# Patient Record
Sex: Female | Born: 1940 | Race: Black or African American | Hispanic: No | Marital: Married | Smoking: Former smoker
Health system: Southern US, Community
[De-identification: ages and names within clinical notes are randomized; demographics above are authoritative.]

## PROBLEM LIST (undated history)

## (undated) DIAGNOSIS — R42 Dizziness and giddiness: Secondary | ICD-10-CM

## (undated) DIAGNOSIS — Z972 Presence of dental prosthetic device (complete) (partial): Secondary | ICD-10-CM

## (undated) DIAGNOSIS — E669 Obesity, unspecified: Secondary | ICD-10-CM

## (undated) DIAGNOSIS — M1990 Unspecified osteoarthritis, unspecified site: Secondary | ICD-10-CM

## (undated) DIAGNOSIS — K5909 Other constipation: Secondary | ICD-10-CM

## (undated) DIAGNOSIS — F411 Generalized anxiety disorder: Secondary | ICD-10-CM

## (undated) DIAGNOSIS — E119 Type 2 diabetes mellitus without complications: Secondary | ICD-10-CM

## (undated) DIAGNOSIS — I10 Essential (primary) hypertension: Secondary | ICD-10-CM

## (undated) DIAGNOSIS — Z974 Presence of external hearing-aid: Secondary | ICD-10-CM

## (undated) DIAGNOSIS — E785 Hyperlipidemia, unspecified: Secondary | ICD-10-CM

## (undated) HISTORY — PX: OTHER SURGICAL HISTORY: SHX169

## (undated) HISTORY — PX: ABDOMINAL HYSTERECTOMY: SHX81

## (undated) HISTORY — PX: COLONOSCOPY: SHX174

## (undated) HISTORY — PX: LAPAROSCOPIC SIGMOID COLECTOMY: SHX5928

## (undated) HISTORY — PX: TOTAL ABDOMINAL HYSTERECTOMY W/ BILATERAL SALPINGOOPHORECTOMY: SHX83

## (undated) HISTORY — PX: JOINT REPLACEMENT: SHX530

---

## 2004-07-07 ENCOUNTER — Ambulatory Visit: Payer: Self-pay | Admitting: Family Medicine

## 2005-02-16 ENCOUNTER — Ambulatory Visit: Payer: Self-pay | Admitting: Unknown Physician Specialty

## 2005-07-13 ENCOUNTER — Ambulatory Visit: Payer: Self-pay | Admitting: Unknown Physician Specialty

## 2006-07-21 ENCOUNTER — Ambulatory Visit: Payer: Self-pay | Admitting: Unknown Physician Specialty

## 2007-07-26 ENCOUNTER — Ambulatory Visit: Payer: Self-pay | Admitting: Internal Medicine

## 2008-03-21 ENCOUNTER — Ambulatory Visit: Payer: Self-pay | Admitting: Family Medicine

## 2008-06-03 ENCOUNTER — Ambulatory Visit: Payer: Self-pay | Admitting: Gastroenterology

## 2008-07-31 ENCOUNTER — Ambulatory Visit: Payer: Self-pay | Admitting: Family Medicine

## 2009-08-25 ENCOUNTER — Ambulatory Visit: Payer: Self-pay | Admitting: Family Medicine

## 2010-09-04 ENCOUNTER — Ambulatory Visit: Payer: Self-pay | Admitting: Family Medicine

## 2011-09-07 ENCOUNTER — Ambulatory Visit: Payer: Self-pay | Admitting: Family Medicine

## 2012-09-07 ENCOUNTER — Ambulatory Visit: Payer: Self-pay | Admitting: Unknown Physician Specialty

## 2013-09-11 ENCOUNTER — Ambulatory Visit: Payer: Self-pay | Admitting: Internal Medicine

## 2014-02-18 ENCOUNTER — Ambulatory Visit: Payer: Self-pay | Admitting: Gastroenterology

## 2014-03-04 ENCOUNTER — Ambulatory Visit: Payer: Self-pay | Admitting: Surgery

## 2014-03-04 LAB — BASIC METABOLIC PANEL
Anion Gap: 6 — ABNORMAL LOW (ref 7–16)
BUN: 9 mg/dL (ref 7–18)
CALCIUM: 9 mg/dL (ref 8.5–10.1)
Chloride: 106 mmol/L (ref 98–107)
Co2: 25 mmol/L (ref 21–32)
Creatinine: 0.95 mg/dL (ref 0.60–1.30)
EGFR (African American): >60
EGFR (Non-African Amer.): >60
Glucose: 108 mg/dL — ABNORMAL HIGH (ref 65–99)
Osmolality: 273 (ref 275–301)
POTASSIUM: 3.7 mmol/L (ref 3.5–5.1)
Sodium: 137 mmol/L (ref 136–145)

## 2014-03-04 LAB — CBC
HCT: 36.2 % (ref 35.0–47.0)
HGB: 11.8 g/dL — AB (ref 12.0–16.0)
MCH: 32 pg (ref 26.0–34.0)
MCHC: 32.5 g/dL (ref 32.0–36.0)
MCV: 99 fL (ref 80–100)
Platelet: 333 10*3/uL (ref 150–440)
RBC: 3.68 10*6/uL — ABNORMAL LOW (ref 3.80–5.20)
RDW: 15.8 % — ABNORMAL HIGH (ref 11.5–14.5)
WBC: 12.2 10*3/uL — ABNORMAL HIGH (ref 3.6–11.0)

## 2014-03-04 LAB — HEPATIC FUNCTION PANEL A (ARMC)
Albumin: 3.2 g/dL — ABNORMAL LOW (ref 3.4–5.0)
Alkaline Phosphatase: 56 U/L
BILIRUBIN TOTAL: 0.3 mg/dL (ref 0.2–1.0)
Bilirubin, Direct: <0.1 mg/dL (ref 0.00–0.20)
SGOT(AST): 17 U/L (ref 15–37)
SGPT (ALT): 24 U/L (ref 12–78)
TOTAL PROTEIN: 6.7 g/dL (ref 6.4–8.2)

## 2014-03-08 ENCOUNTER — Inpatient Hospital Stay: Payer: Self-pay | Admitting: Surgery

## 2014-03-09 LAB — CBC WITH DIFFERENTIAL/PLATELET
BASOS ABS: 0.1 10*3/uL (ref 0.0–0.1)
Basophil %: 0.6 %
Eosinophil #: 0 10*3/uL (ref 0.0–0.7)
Eosinophil %: 0.1 %
HCT: 35.1 % (ref 35.0–47.0)
HGB: 11.3 g/dL — ABNORMAL LOW (ref 12.0–16.0)
Lymphocyte #: 0.8 10*3/uL — ABNORMAL LOW (ref 1.0–3.6)
Lymphocyte %: 6.3 %
MCH: 31.5 pg (ref 26.0–34.0)
MCHC: 32.1 g/dL (ref 32.0–36.0)
MCV: 98 fL (ref 80–100)
MONO ABS: 1 x10 3/mm — AB (ref 0.2–0.9)
Monocyte %: 7.8 %
NEUTROS PCT: 85.2 %
Neutrophil #: 10.9 10*3/uL — ABNORMAL HIGH (ref 1.4–6.5)
PLATELETS: 319 10*3/uL (ref 150–440)
RBC: 3.58 10*6/uL — ABNORMAL LOW (ref 3.80–5.20)
RDW: 15.7 % — ABNORMAL HIGH (ref 11.5–14.5)
WBC: 12.7 10*3/uL — ABNORMAL HIGH (ref 3.6–11.0)

## 2014-03-09 LAB — BASIC METABOLIC PANEL
Anion Gap: 6 — ABNORMAL LOW (ref 7–16)
BUN: 7 mg/dL (ref 7–18)
CALCIUM: 8.2 mg/dL — AB (ref 8.5–10.1)
CREATININE: 1.37 mg/dL — AB (ref 0.60–1.30)
Chloride: 112 mmol/L — ABNORMAL HIGH (ref 98–107)
Co2: 23 mmol/L (ref 21–32)
EGFR (Non-African Amer.): 39 — ABNORMAL LOW
GFR CALC AF AMER: 45 — AB
GLUCOSE: 253 mg/dL — AB (ref 65–99)
Osmolality: 288 (ref 275–301)
POTASSIUM: 3.6 mmol/L (ref 3.5–5.1)
SODIUM: 141 mmol/L (ref 136–145)

## 2014-03-11 LAB — PATHOLOGY REPORT

## 2014-03-12 LAB — WOUND CULTURE

## 2014-03-13 LAB — PLATELET COUNT: PLATELETS: 309 10*3/uL (ref 150–440)

## 2014-04-09 ENCOUNTER — Telehealth: Payer: Self-pay | Admitting: *Deleted

## 2014-04-09 NOTE — Telephone Encounter (Signed)
Patient called regarding her colostomy output. She is using Miralax daily and her stools are still constipated. She talked with her home nurse and she told her to use Colace/stool softner and she was wanting to make sure. I agreed and told her to drink plenty of water and that she can adjust the Miralax (more or less) based on her stool consisently. She agrees.

## 2014-09-09 ENCOUNTER — Ambulatory Visit: Payer: Self-pay | Admitting: Gastroenterology

## 2014-09-12 ENCOUNTER — Ambulatory Visit: Payer: Self-pay | Admitting: Internal Medicine

## 2014-09-16 ENCOUNTER — Ambulatory Visit: Payer: Self-pay | Admitting: Surgery

## 2014-09-23 ENCOUNTER — Ambulatory Visit: Payer: Self-pay | Admitting: Surgery

## 2014-09-23 LAB — CBC WITH DIFFERENTIAL/PLATELET
BASOS PCT: 1 %
Basophil #: 0.1 10*3/uL (ref 0.0–0.1)
EOS ABS: 0.4 10*3/uL (ref 0.0–0.7)
Eosinophil %: 4 %
HCT: 44.5 % (ref 35.0–47.0)
HGB: 14.6 g/dL (ref 12.0–16.0)
Lymphocyte #: 2.5 10*3/uL (ref 1.0–3.6)
Lymphocyte %: 24 %
MCH: 33.4 pg (ref 26.0–34.0)
MCHC: 32.8 g/dL (ref 32.0–36.0)
MCV: 102 fL — ABNORMAL HIGH (ref 80–100)
Monocyte #: 0.9 x10 3/mm (ref 0.2–0.9)
Monocyte %: 8.3 %
NEUTROS ABS: 6.6 10*3/uL — AB (ref 1.4–6.5)
Neutrophil %: 62.7 %
PLATELETS: 281 10*3/uL (ref 150–440)
RBC: 4.38 10*6/uL (ref 3.80–5.20)
RDW: 12.9 % (ref 11.5–14.5)
WBC: 10.6 10*3/uL (ref 3.6–11.0)

## 2014-09-23 LAB — COMPREHENSIVE METABOLIC PANEL
ALBUMIN: 3.7 g/dL (ref 3.4–5.0)
Alkaline Phosphatase: 66 U/L
Anion Gap: 7 (ref 7–16)
BILIRUBIN TOTAL: 0.3 mg/dL (ref 0.2–1.0)
BUN: 18 mg/dL (ref 7–18)
CALCIUM: 9.5 mg/dL (ref 8.5–10.1)
Chloride: 105 mmol/L (ref 98–107)
Co2: 26 mmol/L (ref 21–32)
Creatinine: 0.86 mg/dL (ref 0.60–1.30)
EGFR (African American): >60
GLUCOSE: 94 mg/dL (ref 65–99)
Osmolality: 277 (ref 275–301)
Potassium: 4.2 mmol/L (ref 3.5–5.1)
SGOT(AST): 31 U/L (ref 15–37)
SGPT (ALT): 23 U/L
SODIUM: 138 mmol/L (ref 136–145)
Total Protein: 7.2 g/dL (ref 6.4–8.2)

## 2014-10-03 ENCOUNTER — Inpatient Hospital Stay: Payer: Self-pay | Admitting: Surgery

## 2014-10-04 LAB — CBC WITH DIFFERENTIAL/PLATELET
BASOS ABS: 0.1 10*3/uL (ref 0.0–0.1)
BASOS PCT: 0.6 %
EOS ABS: 0 10*3/uL (ref 0.0–0.7)
Eosinophil %: 0.2 %
HCT: 38.4 % (ref 35.0–47.0)
HGB: 12.8 g/dL (ref 12.0–16.0)
LYMPHS ABS: 1.4 10*3/uL (ref 1.0–3.6)
Lymphocyte %: 10.5 %
MCH: 33.7 pg (ref 26.0–34.0)
MCHC: 33.3 g/dL (ref 32.0–36.0)
MCV: 101 fL — ABNORMAL HIGH (ref 80–100)
MONO ABS: 1 x10 3/mm — AB (ref 0.2–0.9)
Monocyte %: 7.4 %
Neutrophil #: 10.7 10*3/uL — ABNORMAL HIGH (ref 1.4–6.5)
Neutrophil %: 81.3 %
PLATELETS: 218 10*3/uL (ref 150–440)
RBC: 3.79 10*6/uL — ABNORMAL LOW (ref 3.80–5.20)
RDW: 13.3 % (ref 11.5–14.5)
WBC: 13.2 10*3/uL — ABNORMAL HIGH (ref 3.6–11.0)

## 2014-10-04 LAB — BASIC METABOLIC PANEL
ANION GAP: 6 — AB (ref 7–16)
BUN: 8 mg/dL (ref 7–18)
CHLORIDE: 108 mmol/L — AB (ref 98–107)
CO2: 24 mmol/L (ref 21–32)
Calcium, Total: 8.1 mg/dL — ABNORMAL LOW (ref 8.5–10.1)
Creatinine: 1.11 mg/dL (ref 0.60–1.30)
EGFR (African American): >60
GFR CALC NON AF AMER: 51 — AB
Glucose: 191 mg/dL — ABNORMAL HIGH (ref 65–99)
OSMOLALITY: 279 (ref 275–301)
POTASSIUM: 4.3 mmol/L (ref 3.5–5.1)
SODIUM: 138 mmol/L (ref 136–145)

## 2014-10-08 LAB — PLATELET COUNT: Platelet: 290 10*3/uL (ref 150–440)

## 2015-01-25 NOTE — Op Note (Signed)
PATIENT NAME:  Rebecca Frazier, [name] MR#:  [id number] DATE OF BIRTH:  [birth year]  DATE OF PROCEDURE:  03/08/2014  PREOPERATIVE DIAGNOSIS: Diverticulitis with colovaginal fistula.   POSTOPERATIVE DIAGNOSIS: Diverticular abscess with colovaginal fistula.   PROCEDURE: Sigmoid colon resection with Hartmann procedure and drainage of abscess.   SURGEON: Renda RollsWilton Raimi Guillermo, M.D.   ANESTHESIA: General.   INDICATIONS: This 74 year old female has history of abdominal pain, diverticulitis and feculent discharge from the vagina. Surgery was recommended for definitive treatment. It is also that she has had a previous hysterectomy.   DESCRIPTION OF PROCEDURE: The patient was placed on the operating table in the supine position under general endotracheal anesthesia. Legs were elevated into the lithotomy position using bumblebee stirrups. The sigmoidoscope was inserted into the rectum and advanced up 17 cm. The rectal mucosa  appeared normal. No polyps or tumors were seen and it appeared that the scope would not go beyond this point and the scope was gradually pulled back and removed.   The circulating nurse inserted a Foley urinary catheter with Betadine preparation of the perineum, draining clear yellow urine. The abdomen was prepared with ChloraPrep and the perineum and anal area were prepared with Betadine solution. The abdomen was draped out in a sterile manner.   A lower abdominal midline incision was made at the site of an old scar. She does not have an  umbilicus. The incision was carried down through subcutaneous tissues. There was a hernia found at the upper end of the incision containing omentum and this hernia defect was incised and further recognize the extent of the defect, which was small. The abdominal cavity was opened in the midline. The omentum was dissected away from the hernia defect and away from the abdominal wall. Further inspection revealed there was no palpable mass within the liver. There was no  other palpable mass felt elsewhere in the abdomen. There was a firm area of inflammation and mass formation in the pelvis. A Balfour retractor was used and lap packs were inserted to retract the small bowel out of the pelvis. Multiple diverticula were noted in the sigmoid colon. There was a hard inflammatory mass, which is densely adherent to the left pelvic sidewall and also to the region of the posterior aspect of the bladder and the region of the vagina. This was dissected partially with sharp dissection and partially with finger fracture and partially with Harmonic scalpel and bipolar cautery. There was an abscess encountered, which drained yellow pus. A swab was submitted for routine culture. This abscess cavity was opened large enough to insert a finger and it appeared to be approximately 4 cm in dimension. Several small bleeding points were cauterized.   Next, the markedly inflamed portion of sigmoid colon was further mobilized. There was one bleeding point, which was suture ligated with 0 chromic and divided with the Harmonic scalpel. This inflammatory mass was further mobilized and divided the bowel proximal to the mass in an area where the bowel was soft by creating a window in the mesentery and beginning the mesenteric dissection with the Harmonic scalpel and then the bowel was divided with the 75 mm GIA stapler. The staple line appeared to be hemostatic.   Next, the mesenteric dissection was further carried out with the Harmonic scalpel and bipolar cautery and dissected down to a more normal-appearing segment of bowel. Decision was made not to do a primary anastomosis due to the presence of pus in the operative field and marked inflammation. A TA-30 was placed  across the distal portion of bowel, engaged and activated and the specimen was excised with a scalpel and submitted in formalin for routine pathology. The wound was further inspected. Several small bleeding points were cauterized. The wound was  copiously irrigated with saline solution and subsequently appeared that hemostasis was intact. An opening was made in the skin of the left lower quadrant midway between the umbilicus and the anterior/superior iliac spine. The skin was grasped with a Kocher clamp. A circular incision was made approximately 2.8 cm in diameter and carried down through subcutaneous tissues and excised the circular portion of skin. The dissection was carried down through the fat with electrocautery. I made a cruciate incision in the anterior rectus sheath, spread the rectus muscle fibers and made another cruciate incision in the posterior rectus sheath, and entered the peritoneal cavity. This abdominal wall defect was made large enough to admit 2 fingers. Some of the tenia coli was excised from the distal portion of this bowel using the Harmonic scalpel and bipolar cautery.   Next, the bowel was manipulated up through the abdominal wall far enough to allow for creation of a colostomy. The pelvis was further inspected. There was some serosanguineous fluid, which was aspirated. Further inspection revealed hemostasis was intact. It was further noted that the distal staple line was tagged with a 2-0 Prolene suture for future assistance with location. Next, the abdominal incision was closed using a 2-0 chromic running suture in the peritoneum. The fascia was closed with interrupted 0 Maxon figure-of-eight sutures. The skin was closed loosely leaving the skin separated between a number of the staples to allow for drainage. This was covered with a towel.   Next, the colostomy was matured by excising the staple line with electrocautery. Vicryl 5-0 sutures were used to suture the seromuscular coat of the bowel and the full thickness of the distal cut end to the skin circumferentially. Next, the skin was treated with benzoin. A colostomy wafer and bag were applied. The dressings were applied to the midline incision with 2 inch paper tape. The  patient appeared to be in satisfactory condition and we elected to keep the Foley catheter in for monitoring. Plan a therapeutic course of intravenous Invanz pending culture results. The patient was then prepared for transfer to the recovery room.   ____________________________ Shela Commons. Renda Rolls, MD jws:aw D: 03/08/2014 12:30:46 ET T: 03/08/2014 13:40:19 ET JOB#: [id number]  cc: Adella Hare, MD, <Dictator> Adella Hare MD ELECTRONICALLY SIGNED 03/08/2014 21:32

## 2015-01-25 NOTE — Discharge Summary (Signed)
PATIENT NAME:  Rebecca Frazier, Rebecca Frazier MR#:  [id number] DATE OF BIRTH:  05-01-1941  DATE OF ADMISSION:  03/08/2014 DATE OF DISCHARGE:  03/15/2014  HISTORY OF PRESENT ILLNESS: This 74 year old female has a history and diverticulitis and colovaginal fistula.   Details of the past medical history are included on the typed H and P.   It is notable that she has had a previous total abdominal hysterectomy, bilateral salpingo-oophorectomy.  She did have bowel preparation at home and was brought in through the outpatient surgery department, carried to the operating room where she had laparotomy and sigmoid colon resection with findings of colovaginal fistula. There was also intra-abdominal abscess found containing pus and due to the findings of significant infection and also severe scarring, a temporary colostomy was done.   Postoperatively, she was continued on a course of intravenous Invanz for treatment of her abscess.  Her Gram stain demonstrated rare Gram variable rods but with a culture there was no growth in 4 days.   She did progress satisfactorily with clear liquids and gradually advanced her diet. With time, demonstrated ostomy function. She was also given some training on how to manage the bag. Pathology did demonstrate diverticulitis.   DIAGNOSES:  1. Diverticulitis with intra-abdominal abscess. 2. Colovaginal fistula.   OPERATION: Sigmoid colon resection with temporary colostomy.   DISCHARGE INSTRUCTIONS: Wound care instructions given, plans made for home health care nursing and office followup.    ____________________________ J. Renda RollsWilton Smith, MD jws:lt D: 03/26/2014 18:38:56 ET T: 03/26/2014 23:21:41 ET JOB#: [id number]  cc: Adella HareJ. Wilton Smith, MD, <Dictator> Adella HareWILTON J SMITH MD ELECTRONICALLY SIGNED 03/28/2014 18:16

## 2015-01-27 LAB — SURGICAL PATHOLOGY

## 2015-01-29 NOTE — Op Note (Signed)
PATIENT NAME:  Rebecca Frazier, Rebecca Frazier MR#:  [id number] DATE OF BIRTH:  [birth year]  DATE OF PROCEDURE:  10/03/2014  PREOPERATIVE DIAGNOSES: History of diverticulitis with abscess and colovaginal fistula.   POSTOPERATIVE DIAGNOSES: :History of diverticulitis with abscess and colovaginal fistula, ventral hernia.   PROCEDURE: Sigmoid resection with closure of colostomy with pelvic anastomosis, ventral hernia repair, sigmoidoscopy.   SURGEON: Adella Hare, MD   ANESTHESIA: General.   INDICATION: This 74 year old female has a history of diverticular abscess with colovaginal fistula and sigmoid resection with Hartmann procedure. She now is brought for closure of colostomy. She did have findings of a ventral hernia during the course of the procedure.   DESCRIPTION OF PROCEDURE: The patient was placed on the operating table in the supine position under general endotracheal anesthesia. The circulating nurse inserted a Foley urinary catheter with Betadine preparation of the perineum, draining out a clear yellow urine. The patient was placed in the lithotomy position using bumblebee stirrups. Digital rectal exam was with no palpable mass. Rigid proctoscopy was done, finding some residual barium within the rectum, which was evacuated. The scope was inserted some 16 cm. I did not see any polyps or tumors. Next, the sigmoidoscope was removed. The abdomen was prepared with ChloraPrep, and the perineum and colostomy site prepared with Betadine. The abdomen was draped out in a sterile manner.   A lower abdominal midline incision was made at the site of the old scar. She had had previous removal of her navel and the incision extended from what had been her navel to the pubic symphysis and carried down through the subcutaneous tissues. Numerous small bleeding points were cauterized. There was a ventral hernia found at the upper portion of the incision, slightly to the left of the midline. A sac was dissected free from  surrounding structures and excised. The midline fascia was incised. The peritoneal cavity was opened. Initial inspection revealed small bowel. It appeared that the omentum was small in size. There was some omentum adherent to the anterior abdominal wall, and these adhesions were taken down with blunt and sharp dissection and use of electrocautery. There were a number of adhesions attaching the small bowel to the pelvic side wall. These adhesions were lysed sharply with the scissors. The patient was placed in the Trendelenburg position, and the small bowel was packed out of the pelvis for exposure. Next, a Prolene suture, which was previously placed at her previous operation was identified, demonstrating the staple line of her previous surgery and closure at the colorectal junction. There was a finding of some granulation tissue, which was about a centimeter in dimension, which was excised. There was a small bleeding point at this point, which was suture ligated with 0 chromic. The distal rectum was identified, also identified the site of the vagina, which was anterior to it. Next, the 25 mm sizer was inserted up through the anal opening into the rectum and manipulated up to the proximal end of the pouch. Next, the 29 mm sizer was used and then the 33 mm sizer would not go all the way to the proximal end. Glove was changed.   Next, the colostomy was taken down with an elliptical transversely oriented excision and carried down to the fascia. The bowel was mobilized and separated from the abdominal wall and reduced into the abdominal cavity. This bowel was further inspected and an approximately 4-inch segment was resected, which removed all of the part which had been adherent to the abdominal wall and  additional portion of normal-appearing bowel, although there were some diverticula noted in the area. The colon was dissected so that the mesentery was divided and ligated with 0 chromic. The bowel was then divided  sharply with electrocautery and the edges held up with Allis clamps. A 3-0 Prolene pursestring suture was placed. Next, the 25 mm EEA stapler was selected, as the proximal portion of bowel appeared to be small in caliber. The anvil was separated from the stapler and placed into the proximal bowel, and the pursestring was tied down. Next, the pin of the stapler was reduced and the stapler was inserted into the rectum and passed up to just anterior to the old staple line. It is noted that with the stapler in place, I also palpated the vagina, demonstrating the location of the vagina being separate from this portion of the rectum. The pin was brought through the anterior wall of the rectum and was attached to the anvil, and the EEA was then engaged to the firing range, activated, disengaged, and removed. Anastomotic rings were intact. The anastomoses was inspected and looked good. The sigmoidoscope was introduced and could identify the anastomosis and insufflated the bowel, and saw no bubbles coming through the staple line. The sigmoidoscope was removed. My gown and gloves were changed. The pelvis was further examined and could see hemostasis was intact. The site was irrigated with warm saline solution.   Next, the small bowel was further examined as the lap packs were removed. It is noted that the lap pack count was correct; however, of the [employer] sponge count was missing 1 sponge, and I explored the abdomen and did not find a sponge inside the abdomen and also, circulating nurses searched the operative field and other parts of the room. We did call x-ray and did an x-ray of the lower abdomen, and I viewed those x-rays by digital format and saw no sponge. There were some densities seen in the pelvis, consistent with barium, likely from her previous barium enema, but no sponge was seen on the x-ray.   Next, the fascia at the colostomy site was closed with a transversely oriented suture line of interrupted 0 Maxon  figure-of-eight sutures. Next, the lower portion of the peritoneum of the midline incision was closed with a running 2-0 chromic. Next, the ventral hernia, which was at the upper end of the incision and just to the left of the midline with a defect of approximately 2.5 cm, was closed with a transversely oriented suture line of interrupted 0 Maxon figure-of-eight sutures. Next, the midline fascia was closed with interrupted 0 Maxon figure-of-eight sutures. Hemostasis was intact. The skin for both incisions was closed with clips. Dressings were applied using 4 x 4 gauze and 2 inch paper tape.   Urine output had been somewhat low during the course of the procedure, and the Foley catheter was left in place for monitoring, and the patient was prepared for transfer to the recovery room.     ____________________________ Shela CommonsJ. Renda RollsWilton Nkenge Sonntag, MD jws:mw D: 10/03/2014 11:52:09 ET T: 10/03/2014 15:21:18 ET JOB#: [id number]  cc: Adella HareJ. Wilton Amahia Madonia, MD, <Dictator> Adella HareWILTON J Ferry Matthis MD ELECTRONICALLY SIGNED 10/04/2014 11:08

## 2015-02-02 NOTE — Discharge Summary (Signed)
PATIENT NAME:  Rebecca Frazier, Nykayla S MR#:  [id number] DATE OF BIRTH:  1940/10/09  DATE OF ADMISSION:  10/03/2014 DATE OF DISCHARGE:  10/08/2014  HISTORY OF PRESENT ILLNESS: This 74 year old female has a history of diverticulitis and colovaginal fistula and diverticular abscess. She had a sigmoid colectomy in June of 2015 with creation of a colostomy.   Other details of her history and physical are found on the typed H and P.   HOSPITAL COURSE: She did have preop colonoscopy done by Dr. Marva PandaSkulskie, which was incomplete due to tortuosity and did have a barium enema demonstrating no polyp or tumor. Did have residual diverticulosis.  The patient did have bowel preparation and was brought in through the outpatient surgery department and carried to the operating room and had a laparotomy with resection of a segment of sigmoid colon and a low pelvic anastomosis. She also had repair of a ventral hernia.   It is noted that she did have a preop prophylactic antibiotic. While in the hospital was treated with prophylactic subcutaneous heparin.   She was given IV fluids and analgesics. She gradually started doing some walking. She began with a clear liquid diet, later advanced to full liquids and eventually solid food. Did demonstrate bowel function prior to discharge.   Pathology of the segment of colon removed did demonstrate diverticulosis.   FINAL DIAGNOSES: History of diverticulitis with abscess and colovaginal fistula, ventral hernia.   OPERATION: Sigmoid resection with colostomy closure and ventral hernia repair.   Wound care instructions were given and plans made for follow-up in the office.  ____________________________ J. Renda RollsWilton Devota Viruet, MD jws:sb D: 10/22/2014 09:05:44 ET T: 10/22/2014 09:15:13 ET JOB#: [id number]  cc: Adella HareJ. Wilton Eliette Drumwright, MD, <Dictator> Adella HareWILTON J Divonte Senger MD ELECTRONICALLY SIGNED 10/22/2014 18:39

## 2015-06-06 ENCOUNTER — Telehealth: Payer: Self-pay

## 2015-06-06 NOTE — Telephone Encounter (Signed)
Lm with patient reminding her we are closed Monday so she will need to pick up her prep on Tuesday instead.

## 2015-07-31 ENCOUNTER — Other Ambulatory Visit: Payer: Self-pay | Admitting: Internal Medicine

## 2015-07-31 DIAGNOSIS — Z1239 Encounter for other screening for malignant neoplasm of breast: Secondary | ICD-10-CM

## 2015-09-15 ENCOUNTER — Ambulatory Visit
Admission: RE | Admit: 2015-09-15 | Discharge: 2015-09-15 | Disposition: A | Discharge status: Home / Self Care | Payer: Medicare Other | Source: Ambulatory Visit | Attending: Internal Medicine | Admitting: Internal Medicine

## 2015-09-15 ENCOUNTER — Ambulatory Visit: Payer: Self-pay

## 2015-09-15 ENCOUNTER — Other Ambulatory Visit: Payer: Self-pay | Admitting: Internal Medicine

## 2015-09-15 DIAGNOSIS — Z1239 Encounter for other screening for malignant neoplasm of breast: Secondary | ICD-10-CM

## 2015-09-15 DIAGNOSIS — Z1231 Encounter for screening mammogram for malignant neoplasm of breast: Secondary | ICD-10-CM | POA: Insufficient documentation

## 2015-12-08 ENCOUNTER — Ambulatory Visit: Payer: Medicare Other | Admitting: Anesthesiology

## 2015-12-08 ENCOUNTER — Ambulatory Visit
Admission: RE | Admit: 2015-12-08 | Discharge: 2015-12-08 | Disposition: A | Discharge status: Home / Self Care | Payer: Medicare Other | Source: Ambulatory Visit | Attending: Gastroenterology | Admitting: Gastroenterology

## 2015-12-08 ENCOUNTER — Encounter: Payer: Self-pay | Admitting: Anesthesiology

## 2015-12-08 ENCOUNTER — Encounter
Admission: RE | Disposition: A | Discharge status: Home / Self Care | Payer: Self-pay | Source: Ambulatory Visit | Attending: Gastroenterology

## 2015-12-08 DIAGNOSIS — Z1211 Encounter for screening for malignant neoplasm of colon: Secondary | ICD-10-CM | POA: Insufficient documentation

## 2015-12-08 DIAGNOSIS — Z7984 Long term (current) use of oral hypoglycemic drugs: Secondary | ICD-10-CM | POA: Insufficient documentation

## 2015-12-08 DIAGNOSIS — Z888 Allergy status to other drugs, medicaments and biological substances status: Secondary | ICD-10-CM | POA: Diagnosis not present

## 2015-12-08 DIAGNOSIS — E669 Obesity, unspecified: Secondary | ICD-10-CM | POA: Insufficient documentation

## 2015-12-08 DIAGNOSIS — Z79899 Other long term (current) drug therapy: Secondary | ICD-10-CM | POA: Diagnosis not present

## 2015-12-08 DIAGNOSIS — I10 Essential (primary) hypertension: Secondary | ICD-10-CM | POA: Diagnosis not present

## 2015-12-08 DIAGNOSIS — M1990 Unspecified osteoarthritis, unspecified site: Secondary | ICD-10-CM | POA: Diagnosis not present

## 2015-12-08 DIAGNOSIS — Z87891 Personal history of nicotine dependence: Secondary | ICD-10-CM | POA: Insufficient documentation

## 2015-12-08 DIAGNOSIS — E785 Hyperlipidemia, unspecified: Secondary | ICD-10-CM | POA: Insufficient documentation

## 2015-12-08 DIAGNOSIS — Z7982 Long term (current) use of aspirin: Secondary | ICD-10-CM | POA: Diagnosis not present

## 2015-12-08 DIAGNOSIS — K5730 Diverticulosis of large intestine without perforation or abscess without bleeding: Secondary | ICD-10-CM | POA: Diagnosis not present

## 2015-12-08 DIAGNOSIS — E119 Type 2 diabetes mellitus without complications: Secondary | ICD-10-CM | POA: Insufficient documentation

## 2015-12-08 DIAGNOSIS — Z980 Intestinal bypass and anastomosis status: Secondary | ICD-10-CM | POA: Diagnosis not present

## 2015-12-08 DIAGNOSIS — K5909 Other constipation: Secondary | ICD-10-CM | POA: Diagnosis not present

## 2015-12-08 HISTORY — DX: Unspecified osteoarthritis, unspecified site: M19.90

## 2015-12-08 HISTORY — DX: Essential (primary) hypertension: I10

## 2015-12-08 HISTORY — DX: Other constipation: K59.09

## 2015-12-08 HISTORY — DX: Hyperlipidemia, unspecified: E78.5

## 2015-12-08 HISTORY — PX: COLONOSCOPY WITH PROPOFOL: SHX5780

## 2015-12-08 HISTORY — DX: Obesity, unspecified: E66.9

## 2015-12-08 HISTORY — DX: Type 2 diabetes mellitus without complications: E11.9

## 2015-12-08 LAB — GLUCOSE, CAPILLARY: GLUCOSE-CAPILLARY: 192 mg/dL — AB (ref 65–99)

## 2015-12-08 SURGERY — COLONOSCOPY WITH PROPOFOL
Anesthesia: General

## 2015-12-08 MED ORDER — MIDAZOLAM HCL 5 MG/5ML IJ SOLN
INTRAMUSCULAR | Status: DC | PRN
  Administered 2015-12-08: 1 mg via INTRAVENOUS

## 2015-12-08 MED ORDER — LIDOCAINE HCL (PF) 1 % IJ SOLN
INTRAMUSCULAR | Status: AC
  Administered 2015-12-08: 2 mL via INTRADERMAL
  Filled 2015-12-08: qty 2

## 2015-12-08 MED ORDER — PROPOFOL 500 MG/50ML IV EMUL
INTRAVENOUS | Status: DC | PRN
  Administered 2015-12-08: 150 ug/kg/min via INTRAVENOUS

## 2015-12-08 MED ORDER — LIDOCAINE HCL (CARDIAC) 20 MG/ML IV SOLN
INTRAVENOUS | Status: DC | PRN
  Administered 2015-12-08: 30 mg via INTRAVENOUS

## 2015-12-08 MED ORDER — SODIUM CHLORIDE 0.9 % IV SOLN
INTRAVENOUS | Status: DC
  Administered 2015-12-08: 11:00:00 via INTRAVENOUS

## 2015-12-08 MED ORDER — FENTANYL CITRATE (PF) 100 MCG/2ML IJ SOLN
INTRAMUSCULAR | Status: DC | PRN
  Administered 2015-12-08: 50 ug via INTRAVENOUS

## 2015-12-08 MED ORDER — SODIUM CHLORIDE 0.9 % IV SOLN: INTRAVENOUS | Status: DC

## 2015-12-08 MED ORDER — SODIUM CHLORIDE 0.9 % IV SOLN
2.0000 g | Freq: Once | INTRAVENOUS | Status: AC
  Administered 2015-12-08: 2 g via INTRAVENOUS
  Filled 2015-12-08: qty 2000

## 2015-12-08 MED ORDER — LIDOCAINE HCL (PF) 1 % IJ SOLN: 2.0000 mL | Freq: Once | INTRAMUSCULAR | Status: DC

## 2015-12-08 MED ORDER — EPHEDRINE SULFATE 50 MG/ML IJ SOLN
INTRAMUSCULAR | Status: DC | PRN
  Administered 2015-12-08: 5 mg via INTRAVENOUS

## 2015-12-08 MED ORDER — PROPOFOL 10 MG/ML IV BOLUS
INTRAVENOUS | Status: DC | PRN
  Administered 2015-12-08: 50 mg via INTRAVENOUS

## 2015-12-08 NOTE — Transfer of Care (Signed)
Immediate Anesthesia Transfer of Care Note  Patient: Rebecca Frazier  Procedure(s) Performed: Procedure(s): COLONOSCOPY WITH PROPOFOL (N/A)  Patient Location: PACU and Short Stay  Anesthesia Type:General  Level of Consciousness: awake and patient cooperative  Airway & Oxygen Therapy: Patient Spontanous Breathing and Patient connected to nasal cannula oxygen  Post-op Assessment: Report given to RN and Post -op Vital signs reviewed and stable  Post vital signs: Reviewed and stable  Last Vitals:  Filed Vitals:   12/08/15 1000  BP: 131/55  Pulse: 82  Temp: 36.4 C  Resp: 16    Complications: No apparent anesthesia complications

## 2015-12-08 NOTE — H&P (Signed)
Outpatient short stay form Pre-procedure 12/08/2015 10:33 AM Christena DeemMartin U Skulskie MD  Primary Physician: Dr. Leotis ShamesJasmine Singh  Reason for visit:  Screening colonoscopy  History of present illness:  Patient is a 75 year old female presenting today for colonoscopy. She has a history of a sigmoid diverticulitis with colovesical fistula that required surgery with colostomy and then reversal of that procedure. She is presenting today for screening.  She tolerated her prep well. She takes 81 mg aspirin.  Current facility-administered medications:  .  0.9 %  sodium chloride infusion, , Intravenous, Continuous, Christena DeemMartin U Skulskie, MD, Last Rate: 20 mL/hr at 12/08/15 1032 .  0.9 %  sodium chloride infusion, , Intravenous, Continuous, Christena DeemMartin U Skulskie, MD .  ampicillin (OMNIPEN) 2 g in sodium chloride 0.9 % 50 mL IVPB, 2 g, Intravenous, Once, Christena DeemMartin U Skulskie, MD, 2 g at 12/08/15 1033 .  lidocaine (PF) (XYLOCAINE) 1 % injection 2 mL, 2 mL, Intradermal, Once, Christena DeemMartin U Skulskie, MD .  lidocaine (PF) (XYLOCAINE) 1 % injection, , , ,   Prescriptions prior to admission  Medication Sig Dispense Refill Last Dose  . acetaminophen (TYLENOL) 650 MG CR tablet Take 650 mg by mouth every 8 (eight) hours as needed for pain.     Marland Kitchen. amLODipine (NORVASC) 10 MG tablet Take 10 mg by mouth daily.   12/07/2015 at Unknown time  . Ascorbic Acid (VITAMIN C) 1000 MG tablet Take 1,000 mg by mouth daily.   Past Week at Unknown time  . aspirin EC 81 MG tablet Take 81 mg by mouth daily.   12/06/2015  . Calcium Carbonate-Vitamin D (CALTRATE 600+D) 600-400 MG-UNIT tablet Take 1 tablet by mouth daily.   Past Week at Unknown time  . hydrochlorothiazide (MICROZIDE) 12.5 MG capsule Take 12.5 mg by mouth daily.   12/07/2015 at Unknown time  . losartan (COZAAR) 100 MG tablet Take 100 mg by mouth daily.   12/07/2015 at Unknown time  . lovastatin (MEVACOR) 20 MG tablet Take 20 mg by mouth at bedtime.   12/07/2015 at Unknown time  . metFORMIN  (GLUCOPHAGE) 500 MG tablet Take 500 mg by mouth 2 (two) times daily with a meal.   12/07/2015 at Unknown time  . Multiple Vitamin (MULTIVITAMIN) capsule Take 1 capsule by mouth daily.   Past Week at Unknown time  . vitamin E 400 UNIT capsule Take 400 Units by mouth daily.   Past Week at Unknown time     Allergies  Allergen Reactions  . Ace Inhibitors      Past Medical History  Diagnosis Date  . Chronic constipation   . Arthritis   . Diabetes mellitus without complication (HCC)   . Hypertension   . Hyperlipemia   . Obesity     Review of systems:      Physical Exam    Heart and lungs: Regular rate and rhythm without rub or gallop, lungs are bilaterally clear.    HEENT: Normocephalic atraumatic eyes are anicteric    Other:     Pertinant exam for procedure: Soft nontender nondistended bowel sounds positive normoactive. Mild protuberance.    Planned proceedures: Colonoscopy and indicated procedures. I have discussed the risks benefits and complications of procedures to include not limited to bleeding, infection, perforation and the risk of sedation and the patient wishes to proceed.

## 2015-12-08 NOTE — Op Note (Addendum)
[facility] Gastroenterology Patient Name: Rebecca PhoBetty Frazier Procedure Date: 12/08/2015 10:44 AM MRN: [id number] Account #: [id number] Date of Birth: 1941-03-12 Admit Type: Outpatient Age: [age] [facility] END[facility] ROOM 3 Gender: Female Note Status: Supervisor Override Procedure:            Colonoscopy Indications:          Screening for colorectal malignant neoplasm Providers:            Christena DeemMartin U. Lashawnna Lambrecht, MD Referring MD:         Leotis ShamesJasmine Singh (Referring MD) Medicines:            Monitored Anesthesia Care Complications:        No immediate complications. Procedure:            Pre-Anesthesia Assessment:                       - ASA Grade Assessment: III - A patient with severe                        systemic disease.                       After obtaining informed consent, the colonoscope was                        passed under direct vision. Throughout the procedure,                        the patient's blood pressure, pulse, and oxygen                        saturations were monitored continuously. The                        Colonoscope was introduced through the anus and                        advanced to the the cecum, identified by appendiceal                        orifice and ileocecal valve. The colonoscopy was                        performed without difficulty. The patient tolerated the                        procedure well. The quality of the bowel preparation                        was good. Findings:      There was evidence of a prior functional end-to-end colo-colonic       anastomosis in the distal sigmoid colon. This was patent and was       characterized by healthy appearing mucosa, mild stenosis.This did not       impair passage of the scope and showed no evidence of ulceration or       irritation. The anastomosis was traversed. There is also a blind pouch       adjacent showing a normal lumen, some diverticulosis and staples at the       terminus of the  blind pouch.  Multiple small and large-mouthed diverticula were found in the sigmoid       colon, descending colon, splenic flexure, transverse colon and ascending       colon.      The digital rectal exam was normal. Impression:           - Patent functional end-to-end colo-colonic                        anastomosis, characterized by healthy appearing mucosa,                        mild stenosis and stenosis.                       - Diverticulosis in the sigmoid colon, in the                        descending colon, at the splenic flexure, in the                        transverse colon and in the ascending colon.                       - No specimens collected. Recommendation:       - Discharge patient to home.                       - Repeat colonoscopy in 5 years for screening purposes. Procedure Code(s):    --- Professional ---                       [phone], Colonoscopy, flexible; diagnostic, including                        collection of specimen(s) by brushing or washing, when                        performed (separate procedure) Diagnosis Code(s):    --- Professional ---                       Z12.11, Encounter for screening for malignant neoplasm                        of colon                       Z98.0, Intestinal bypass and anastomosis status                       K57.30, Diverticulosis of large intestine without                        perforation or abscess without bleeding CPT copyright 2016 [employer]. All rights reserved. The codes documented in this report are preliminary and upon coder review may  be revised to meet current compliance requirements. Christena Deem, MD 12/08/2015 11:14:49 AM This report has been signed electronically. Number of Addenda: 0 Note Initiated On: 12/08/2015 10:44 AM Scope Withdrawal Time: 0 hours 5 minutes 8 seconds  Total Procedure Duration: 0 hours 15 minutes 9 seconds       [facility]

## 2015-12-08 NOTE — Anesthesia Postprocedure Evaluation (Signed)
Anesthesia Post Note  Patient: Arthor CaptainBetty S Polidore  Procedure(s) Performed: Procedure(s) (LRB): COLONOSCOPY WITH PROPOFOL (N/A)  Patient location during evaluation: Endoscopy Anesthesia Type: General Level of consciousness: awake and alert Pain management: pain level controlled Vital Signs Assessment: post-procedure vital signs reviewed and stable Respiratory status: spontaneous breathing, nonlabored ventilation, respiratory function stable and patient connected to nasal cannula oxygen Cardiovascular status: blood pressure returned to baseline and stable Postop Assessment: no signs of nausea or vomiting Anesthetic complications: no    Last Vitals:  Filed Vitals:   12/08/15 1130 12/08/15 1140  BP: 132/74 125/67  Pulse: 86 78  Temp:    Resp: 22 17    Last Pain: There were no vitals filed for this visit.               Keyshawn Hellwig S

## 2015-12-08 NOTE — Anesthesia Preprocedure Evaluation (Addendum)
Anesthesia Evaluation  Patient identified by MRN, date of birth, ID band Patient awake    Reviewed: Allergy & Precautions, NPO status , Patient's Chart, lab work & pertinent test results, reviewed documented beta blocker date and time   Airway Mallampati: II  TM Distance: >3 FB     Dental  (+) Chipped, Partial Upper   Pulmonary former smoker,           Cardiovascular hypertension, Pt. on medications      Neuro/Psych    GI/Hepatic   Endo/Other  diabetes, Type 2  Renal/GU      Musculoskeletal  (+) Arthritis ,   Abdominal   Peds  Hematology   Anesthesia Other Findings   Reproductive/Obstetrics                            Anesthesia Physical Anesthesia Plan  ASA: II  Anesthesia Plan: General   Post-op Pain Management:    Induction: Intravenous  Airway Management Planned: Nasal Cannula  Additional Equipment:   Intra-op Plan:   Post-operative Plan:   Informed Consent: I have reviewed the patients History and Physical, chart, labs and discussed the procedure including the risks, benefits and alternatives for the proposed anesthesia with the patient or authorized representative who has indicated his/her understanding and acceptance.     Plan Discussed with: CRNA  Anesthesia Plan Comments:         Anesthesia Quick Evaluation

## 2016-08-05 ENCOUNTER — Other Ambulatory Visit: Payer: Self-pay | Admitting: Internal Medicine

## 2016-08-05 DIAGNOSIS — Z1231 Encounter for screening mammogram for malignant neoplasm of breast: Secondary | ICD-10-CM

## 2016-09-15 ENCOUNTER — Ambulatory Visit
Admission: RE | Admit: 2016-09-15 | Discharge: 2016-09-15 | Disposition: A | Discharge status: Home / Self Care | Payer: Medicare Other | Source: Ambulatory Visit | Attending: Internal Medicine | Admitting: Internal Medicine

## 2016-09-15 ENCOUNTER — Encounter: Payer: Self-pay | Admitting: Radiology

## 2016-09-15 DIAGNOSIS — Z1231 Encounter for screening mammogram for malignant neoplasm of breast: Secondary | ICD-10-CM | POA: Diagnosis present

## 2017-02-23 ENCOUNTER — Emergency Department
Admission: EM | Admit: 2017-02-23 | Discharge: 2017-02-23 | Disposition: A | Discharge status: Home / Self Care | Payer: Medicare Other | Attending: Emergency Medicine | Admitting: Emergency Medicine

## 2017-02-23 ENCOUNTER — Encounter: Payer: Self-pay | Admitting: Emergency Medicine

## 2017-02-23 ENCOUNTER — Emergency Department: Payer: Medicare Other

## 2017-02-23 DIAGNOSIS — E119 Type 2 diabetes mellitus without complications: Secondary | ICD-10-CM | POA: Diagnosis not present

## 2017-02-23 DIAGNOSIS — R0600 Dyspnea, unspecified: Secondary | ICD-10-CM

## 2017-02-23 DIAGNOSIS — Z7982 Long term (current) use of aspirin: Secondary | ICD-10-CM | POA: Insufficient documentation

## 2017-02-23 DIAGNOSIS — I10 Essential (primary) hypertension: Secondary | ICD-10-CM | POA: Insufficient documentation

## 2017-02-23 DIAGNOSIS — F17200 Nicotine dependence, unspecified, uncomplicated: Secondary | ICD-10-CM | POA: Insufficient documentation

## 2017-02-23 DIAGNOSIS — Z7984 Long term (current) use of oral hypoglycemic drugs: Secondary | ICD-10-CM | POA: Insufficient documentation

## 2017-02-23 DIAGNOSIS — R0602 Shortness of breath: Secondary | ICD-10-CM | POA: Diagnosis present

## 2017-02-23 DIAGNOSIS — R110 Nausea: Secondary | ICD-10-CM

## 2017-02-23 DIAGNOSIS — L299 Pruritus, unspecified: Secondary | ICD-10-CM | POA: Diagnosis not present

## 2017-02-23 DIAGNOSIS — R112 Nausea with vomiting, unspecified: Secondary | ICD-10-CM | POA: Insufficient documentation

## 2017-02-23 LAB — CBC WITH DIFFERENTIAL/PLATELET
BASOS ABS: 0.1 10*3/uL (ref 0–0.1)
BASOS PCT: 1 %
EOS ABS: 0.7 10*3/uL (ref 0–0.7)
Eosinophils Relative: 8 %
HCT: 41.9 % (ref 35.0–47.0)
Hemoglobin: 14.6 g/dL (ref 12.0–16.0)
LYMPHS PCT: 30 %
Lymphs Abs: 2.9 10*3/uL (ref 1.0–3.6)
MCH: 33.3 pg (ref 26.0–34.0)
MCHC: 34.8 g/dL (ref 32.0–36.0)
MCV: 95.7 fL (ref 80.0–100.0)
Monocytes Absolute: 1 10*3/uL — ABNORMAL HIGH (ref 0.2–0.9)
Monocytes Relative: 11 %
Neutro Abs: 4.7 10*3/uL (ref 1.4–6.5)
Neutrophils Relative %: 50 %
Platelets: 254 10*3/uL (ref 150–440)
RBC: 4.38 MIL/uL (ref 3.80–5.20)
RDW: 13.5 % (ref 11.5–14.5)
WBC: 9.5 10*3/uL (ref 3.6–11.0)

## 2017-02-23 LAB — COMPREHENSIVE METABOLIC PANEL
ALT: 19 U/L (ref 14–54)
AST: 30 U/L (ref 15–41)
Albumin: 4.3 g/dL (ref 3.5–5.0)
Alkaline Phosphatase: 66 U/L (ref 38–126)
Anion gap: 10 (ref 5–15)
BUN: 17 mg/dL (ref 6–20)
CHLORIDE: 105 mmol/L (ref 101–111)
CO2: 25 mmol/L (ref 22–32)
CREATININE: 0.94 mg/dL (ref 0.44–1.00)
Calcium: 9.2 mg/dL (ref 8.9–10.3)
GFR calc non Af Amer: 58 mL/min — ABNORMAL LOW (ref >60)
Glucose, Bld: 119 mg/dL — ABNORMAL HIGH (ref 65–99)
POTASSIUM: 4.2 mmol/L (ref 3.5–5.1)
SODIUM: 140 mmol/L (ref 135–145)
Total Bilirubin: 0.5 mg/dL (ref 0.3–1.2)
Total Protein: 7.3 g/dL (ref 6.5–8.1)

## 2017-02-23 LAB — TROPONIN I: Troponin I: <0.03 ng/mL (ref <0.03)

## 2017-02-23 MED ORDER — ONDANSETRON 4 MG PO TBDP
ORAL_TABLET | ORAL | Status: AC
  Filled 2017-02-23: qty 1

## 2017-02-23 MED ORDER — ONDANSETRON 4 MG PO TBDP
4.0000 mg | ORAL_TABLET | Freq: Once | ORAL | Status: AC | PRN
  Administered 2017-02-23: 4 mg via ORAL

## 2017-02-23 MED ORDER — DIPHENHYDRAMINE HCL 25 MG PO CAPS
25.0000 mg | ORAL_CAPSULE | Freq: Once | ORAL | Status: AC
  Administered 2017-02-23: 25 mg via ORAL
  Filled 2017-02-23: qty 1

## 2017-02-23 MED ORDER — ONDANSETRON 4 MG PO TBDP: 4.0000 mg | ORAL_TABLET | Freq: Three times a day (TID) | ORAL | 0 refills | Status: DC | PRN

## 2017-02-23 MED ORDER — ALBUTEROL SULFATE (2.5 MG/3ML) 0.083% IN NEBU: 5.0000 mg | INHALATION_SOLUTION | Freq: Once | RESPIRATORY_TRACT | Status: DC

## 2017-02-23 NOTE — ED Notes (Signed)
ED MD at bedside at this time.

## 2017-02-23 NOTE — Discharge Instructions (Signed)
As we discussed please use Benadryl for any further itching, as written on the box. If you have any further trouble breathing nausea or develop any chest pain please return to the emergency department immediately. Please follow-up with your doctor in the next 2 days for recheck/reevaluation.

## 2017-02-23 NOTE — ED Triage Notes (Signed)
Pt reports she developed shortness of breath about an hr ago, reports was getting ready to go to bed and started to have cough, nasal congestion and nausea, pt had 3 episodes of vomiting whiles in triage. Denies any chest pain. Pt talks in complete sentences.

## 2017-02-23 NOTE — ED Provider Notes (Signed)
[facility] Emergency Department Provider Note  Time seen: 11:40 PM  I have reviewed the triage vital signs and the nursing notes.   HISTORY  Chief Complaint Cough; Nasal Congestion; and Shortness of Breath    HPI Rebecca Frazier is a 76 y.o. female with a past medical history of diabetes, hypertension, hyperlipidemia, presents to the emergency department with shortness of breath nausea and vomiting. According to the patient she had been feeling well throughout the day today, she went to lie down to go to sleep around 9:30 PM. Patient states she suddenly began having itching over her chest and under arms. Felt a feeling of shortness of breath became nauseated and vomited twice. States after vomiting she felt better continued to have some mild nausea, she came to the emergency department, received oral Zofran in the waiting room, now states she feels back to normal denies any symptoms. Denies any chest pain at any point. Denies any abdominal pain at any point. Denies any recent illnesses cough congestion or fever. Denies any diarrhea. Denies any rash or urticaria. Denies any new food exposures, etc.  Past Medical History:  Diagnosis Date  . Arthritis   . Chronic constipation   . Diabetes mellitus without complication (HCC)   . Hyperlipemia   . Hypertension   . Obesity     There are no active problems to display for this patient.   Past Surgical History:  Procedure Laterality Date  . ABDOMINAL HYSTERECTOMY    . CESAREAN SECTION    . COLONOSCOPY    . COLONOSCOPY WITH PROPOFOL N/A 12/08/2015   Procedure: COLONOSCOPY WITH PROPOFOL;  Surgeon: Christena DeemMartin U Skulskie, MD;  [facility];  Service: Endoscopy;  Laterality: N/A;  . JOINT REPLACEMENT    . LAPAROSCOPIC SIGMOID COLECTOMY    . TOTAL ABDOMINAL HYSTERECTOMY W/ BILATERAL SALPINGOOPHORECTOMY    . 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 Authorizing Provider  acetaminophen (TYLENOL) 650 MG CR tablet Take 650 mg by mouth every 8 (eight) hours as needed for pain.    [provider]  amLODipine (NORVASC) 10 MG tablet Take 10 mg by mouth daily.    [provider]  Ascorbic Acid (VITAMIN C) 1000 MG tablet Take 1,000 mg by mouth daily.    [provider]  aspirin EC 81 MG tablet Take 81 mg by mouth daily.    [provider]  Calcium Carbonate-Vitamin D (CALTRATE 600+D) 600-400 MG-UNIT tablet Take 1 tablet by mouth daily.    [provider]  hydrochlorothiazide (MICROZIDE) 12.5 MG capsule Take 12.5 mg by mouth daily.    [provider]  losartan (COZAAR) 100 MG tablet Take 100 mg by mouth daily.    [provider]  lovastatin (MEVACOR) 20 MG tablet Take 20 mg by mouth at bedtime.    [provider]  metFORMIN (GLUCOPHAGE) 500 MG tablet Take 500 mg by mouth 2 (two) times daily with a meal.    [provider]  Multiple Vitamin (MULTIVITAMIN) capsule Take 1 capsule by mouth daily.    [provider]  vitamin E 400 UNIT capsule Take 400 Units by mouth daily.    [provider]    Allergies  Allergen Reactions  . Ace Inhibitors     Family History  Problem Relation Age of Onset  . Breast cancer Neg Hx     Social History  Social History  Substance Use Topics  . Smoking status: Current Some Day Smoker  . Smokeless tobacco: Current User  . Alcohol use No    Review of Systems Constitutional: Negative for fever. Eyes: Negative for visual changes. ENT: Negative for congestion Cardiovascular: Negative for chest pain. Respiratory: Positive for shortness of breath, mild, now resolved. Gastrointestinal: Negative for abdominal pain. Positive for nausea and vomiting 2. Negative for diarrhea. Genitourinary: Negative for dysuria. Musculoskeletal: Negative for back pain. Skin: Negative for rash. Positive for  itching. Neurological: Negative for headache All other ROS negative  ____________________________________________   PHYSICAL EXAM:  VITAL SIGNS: ED Triage Vitals  Enc Vitals Group     BP 02/23/17 2227 (!) 141/67     Pulse Rate 02/23/17 2227 (!) 106     Resp 02/23/17 2227 20     Temp 02/23/17 2227 98.6 F (37 C)     Temp Source 02/23/17 2227 Oral     SpO2 02/23/17 2227 97 %     Weight 02/23/17 2228 170 lb (77.1 kg)     Height 02/23/17 2228 5' (1.524 m)     Head Circumference --      Peak Flow --      Pain Score --      Pain Loc --      Pain Edu? --      Excl. in GC? --     Constitutional: Alert and oriented. Well appearing and in no distress. Eyes: Normal exam ENT   Head: Normocephalic and atraumatic   Mouth/Throat: Mucous membranes are moist. Cardiovascular: Normal rate, regular rhythm. No murmur Respiratory: Normal respiratory effort without tachypnea nor retractions. Breath sounds are clear  Gastrointestinal: Soft and nontender. No distention.   Musculoskeletal: Nontender with normal range of motion in all extremities. No lower extremity tenderness. Mild edema equal bilaterally. Neurologic:  Normal speech and language. No gross focal neurologic deficits Skin:  Skin is warm, dry and intact.  Psychiatric: Mood and affect are normal.   ____________________________________________    EKG  EKG reviewed and interpreted by myself shows sinus tachycardia 111 bpm, narrow QRS, normal axis, normal intervals, nonspecific ST changes without ST elevation.  ____________________________________________    RADIOLOGY  Chest x-ray is negative  ____________________________________________   INITIAL IMPRESSION / ASSESSMENT AND PLAN / ED COURSE  Pertinent labs & imaging results that were available during my care of the patient were reviewed by me and considered in my medical decision making (see chart for details).  The patient presents to the emergency department  with acute onset of itching, shortness of breath nausea and vomiting. Patient states she rubbed her underarms and chest with rubbing alcohol which stopped the itching, after vomiting states she feels much better. Denies any wheezing rash or hives. Denies any new exposure. Symptoms are somewhat suggestive of an allergic reaction. Given the patient's shortness of breath with nausea I discussed with the patient obtaining a repeat troponin as her first troponin and labs are normal. Patient states she feels back to normal and she would rather discharged home. I discussed with the patient using Benadryl for any further itching if symptoms recur. If she develops any further shortness breath nausea or chest pain she is to return to the emergency department for further workup. Patient agreeable to plan.  ____________________________________________   FINAL CLINICAL IMPRESSION(S) / ED DIAGNOSES  Dyspnea Nausea    Minna Antis, MD 02/23/17 [id number]

## 2017-02-23 NOTE — ED Notes (Signed)

## 2017-08-08 ENCOUNTER — Other Ambulatory Visit: Payer: Self-pay | Admitting: Internal Medicine

## 2017-08-08 DIAGNOSIS — Z1231 Encounter for screening mammogram for malignant neoplasm of breast: Secondary | ICD-10-CM

## 2017-09-19 ENCOUNTER — Ambulatory Visit
Admission: RE | Admit: 2017-09-19 | Discharge: 2017-09-19 | Disposition: A | Discharge status: Home / Self Care | Payer: Medicare Other | Source: Ambulatory Visit | Attending: Internal Medicine | Admitting: Internal Medicine

## 2017-09-19 DIAGNOSIS — Z1231 Encounter for screening mammogram for malignant neoplasm of breast: Secondary | ICD-10-CM | POA: Insufficient documentation

## 2017-11-02 ENCOUNTER — Other Ambulatory Visit: Payer: Self-pay | Admitting: Orthopedic Surgery

## 2017-11-02 DIAGNOSIS — M25562 Pain in left knee: Principal | ICD-10-CM

## 2017-11-02 DIAGNOSIS — G8929 Other chronic pain: Secondary | ICD-10-CM

## 2017-11-14 ENCOUNTER — Ambulatory Visit
Admission: RE | Admit: 2017-11-14 | Discharge: 2017-11-14 | Disposition: A | Discharge status: Home / Self Care | Payer: Medicare Other | Source: Ambulatory Visit | Attending: Orthopedic Surgery | Admitting: Orthopedic Surgery

## 2017-11-14 DIAGNOSIS — M1712 Unilateral primary osteoarthritis, left knee: Secondary | ICD-10-CM | POA: Insufficient documentation

## 2017-11-14 DIAGNOSIS — S83282A Other tear of lateral meniscus, current injury, left knee, initial encounter: Secondary | ICD-10-CM | POA: Diagnosis not present

## 2017-11-14 DIAGNOSIS — G8929 Other chronic pain: Secondary | ICD-10-CM | POA: Insufficient documentation

## 2017-11-14 DIAGNOSIS — M25562 Pain in left knee: Secondary | ICD-10-CM | POA: Diagnosis present

## 2017-11-14 DIAGNOSIS — X58XXXA Exposure to other specified factors, initial encounter: Secondary | ICD-10-CM | POA: Insufficient documentation

## 2018-08-10 ENCOUNTER — Other Ambulatory Visit: Payer: Self-pay | Admitting: Internal Medicine

## 2018-08-10 DIAGNOSIS — Z1231 Encounter for screening mammogram for malignant neoplasm of breast: Secondary | ICD-10-CM

## 2018-09-20 ENCOUNTER — Ambulatory Visit
Admission: RE | Admit: 2018-09-20 | Discharge: 2018-09-20 | Disposition: A | Discharge status: Home / Self Care | Payer: Medicare Other | Source: Ambulatory Visit | Attending: Internal Medicine | Admitting: Internal Medicine

## 2018-09-20 DIAGNOSIS — Z1231 Encounter for screening mammogram for malignant neoplasm of breast: Secondary | ICD-10-CM | POA: Diagnosis not present

## 2019-08-14 ENCOUNTER — Other Ambulatory Visit: Payer: Self-pay | Admitting: Internal Medicine

## 2019-08-14 DIAGNOSIS — Z1231 Encounter for screening mammogram for malignant neoplasm of breast: Secondary | ICD-10-CM

## 2019-09-24 ENCOUNTER — Ambulatory Visit
Admission: RE | Admit: 2019-09-24 | Discharge: 2019-09-24 | Disposition: A | Discharge status: Home / Self Care | Payer: Medicare Other | Source: Ambulatory Visit | Attending: Internal Medicine | Admitting: Internal Medicine

## 2019-09-24 DIAGNOSIS — Z1231 Encounter for screening mammogram for malignant neoplasm of breast: Secondary | ICD-10-CM | POA: Diagnosis not present

## 2020-08-19 ENCOUNTER — Other Ambulatory Visit: Payer: Self-pay | Admitting: Internal Medicine

## 2020-08-19 DIAGNOSIS — Z1231 Encounter for screening mammogram for malignant neoplasm of breast: Secondary | ICD-10-CM

## 2020-10-27 ENCOUNTER — Other Ambulatory Visit: Payer: Self-pay

## 2020-10-27 ENCOUNTER — Ambulatory Visit
Admission: RE | Admit: 2020-10-27 | Discharge: 2020-10-27 | Disposition: A | Discharge status: Home / Self Care | Payer: Medicare Other | Source: Ambulatory Visit | Attending: Internal Medicine | Admitting: Internal Medicine

## 2020-10-27 DIAGNOSIS — Z1231 Encounter for screening mammogram for malignant neoplasm of breast: Secondary | ICD-10-CM | POA: Diagnosis present

## 2021-09-21 ENCOUNTER — Other Ambulatory Visit: Payer: Self-pay | Admitting: Internal Medicine

## 2021-09-21 DIAGNOSIS — Z1231 Encounter for screening mammogram for malignant neoplasm of breast: Secondary | ICD-10-CM

## 2021-11-09 ENCOUNTER — Other Ambulatory Visit: Payer: Self-pay

## 2021-11-09 ENCOUNTER — Ambulatory Visit
Admission: RE | Admit: 2021-11-09 | Discharge: 2021-11-09 | Disposition: A | Discharge status: Home / Self Care | Payer: Medicare Other | Source: Ambulatory Visit | Attending: Internal Medicine | Admitting: Internal Medicine

## 2021-11-09 DIAGNOSIS — Z1231 Encounter for screening mammogram for malignant neoplasm of breast: Secondary | ICD-10-CM | POA: Diagnosis present

## 2022-06-06 IMAGING — MG MM DIGITAL SCREENING BILAT W/ TOMO AND CAD
8 series · 8 of 24 positions shown · non-contrast
Comparison: Previous exam(s).

CLINICAL DATA: Screening.

EXAM:
DIGITAL SCREENING BILATERAL MAMMOGRAM WITH TOMOSYNTHESIS AND CAD
TECHNIQUE: Bilateral screening digital craniocaudal and mediolateral oblique
mammograms were obtained. Bilateral screening digital breast
tomosynthesis was performed. The images were evaluated with
computer-aided detection.

[R CC synth-2D]
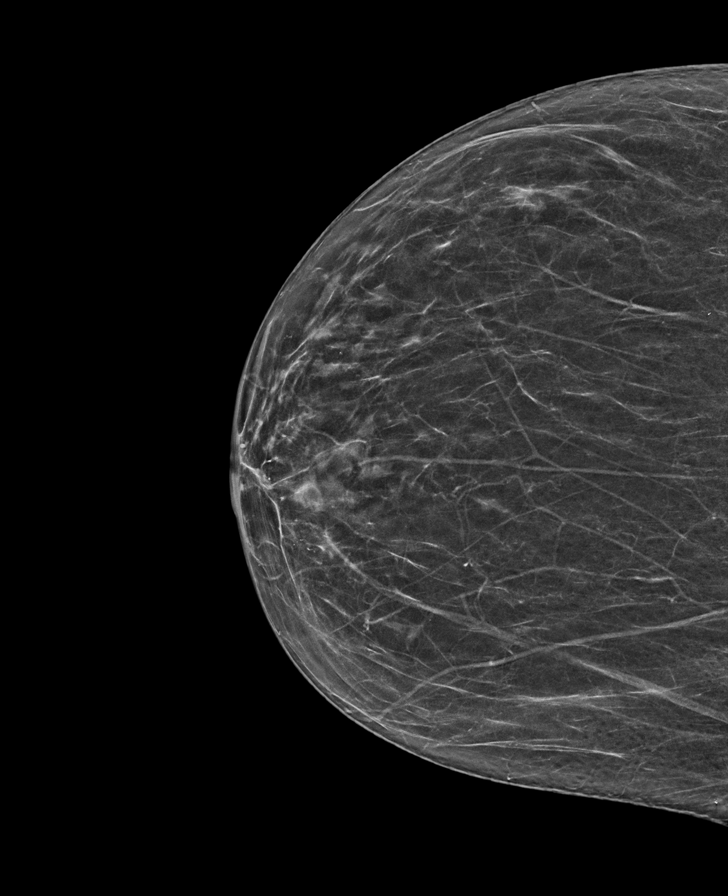

[R MLO synth-2D]
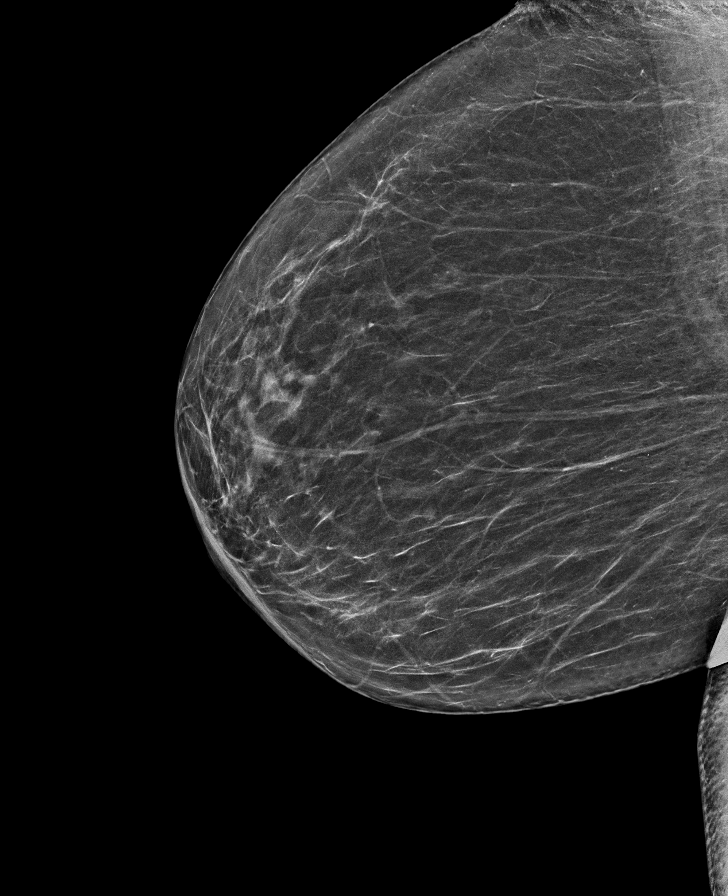

[L CC synth-2D]
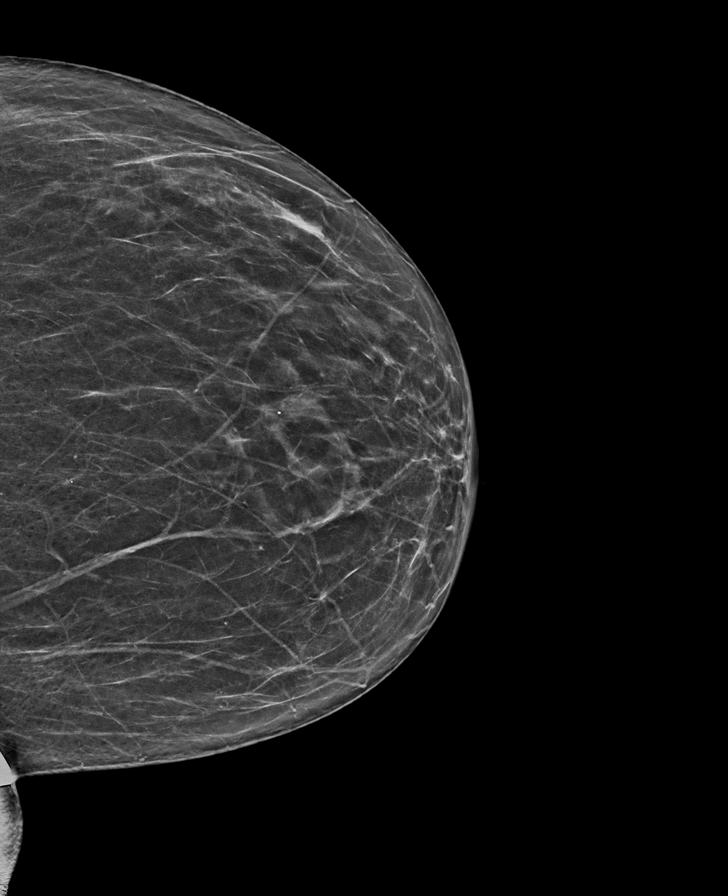

[L MLO synth-2D]
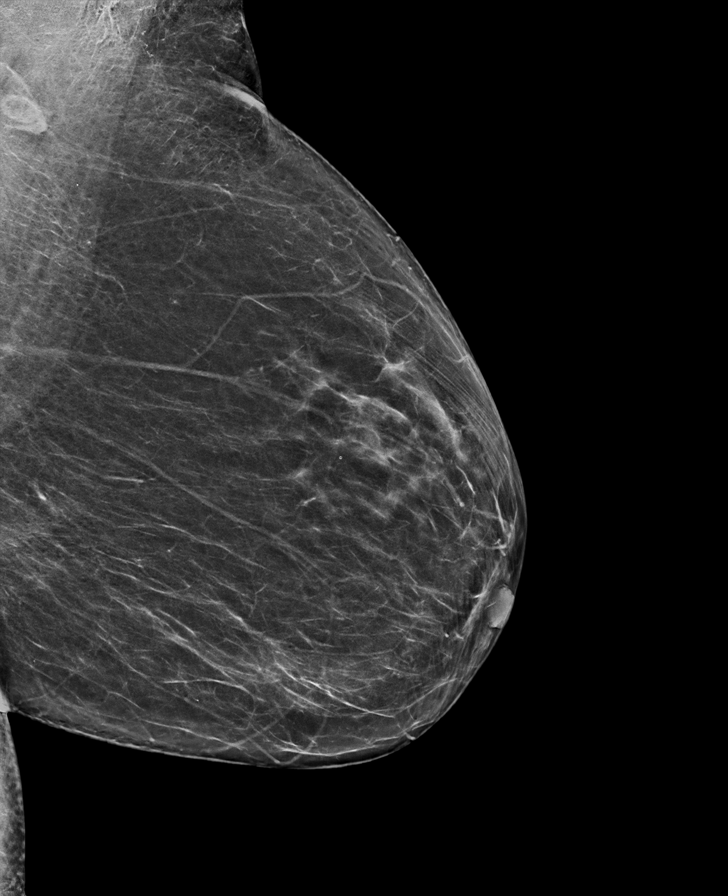

[L CC tomo · tomo slice 33/64.0]
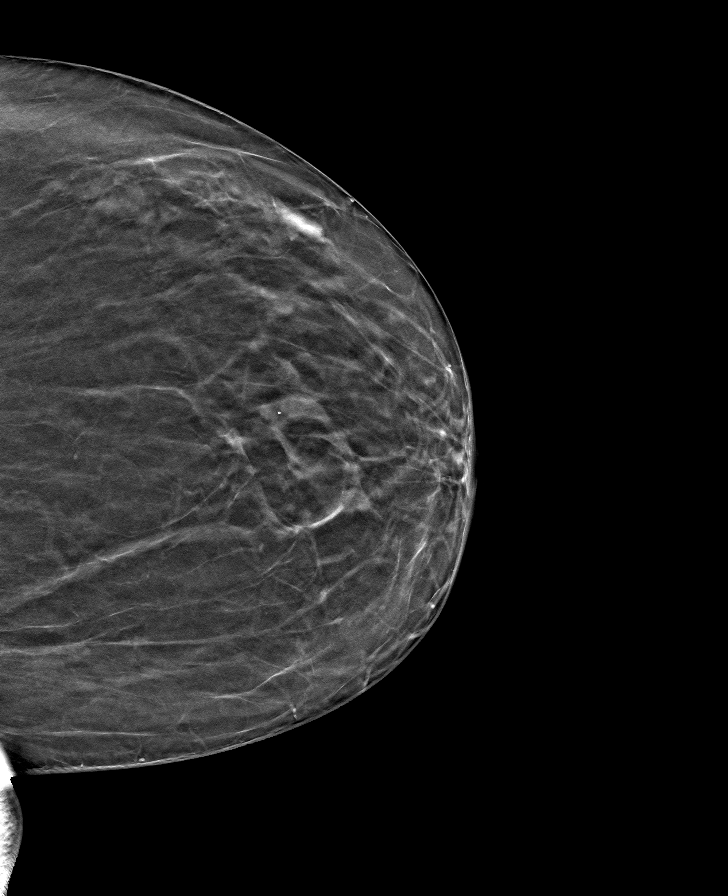

[R CC tomo · tomo slice 30/59.0]
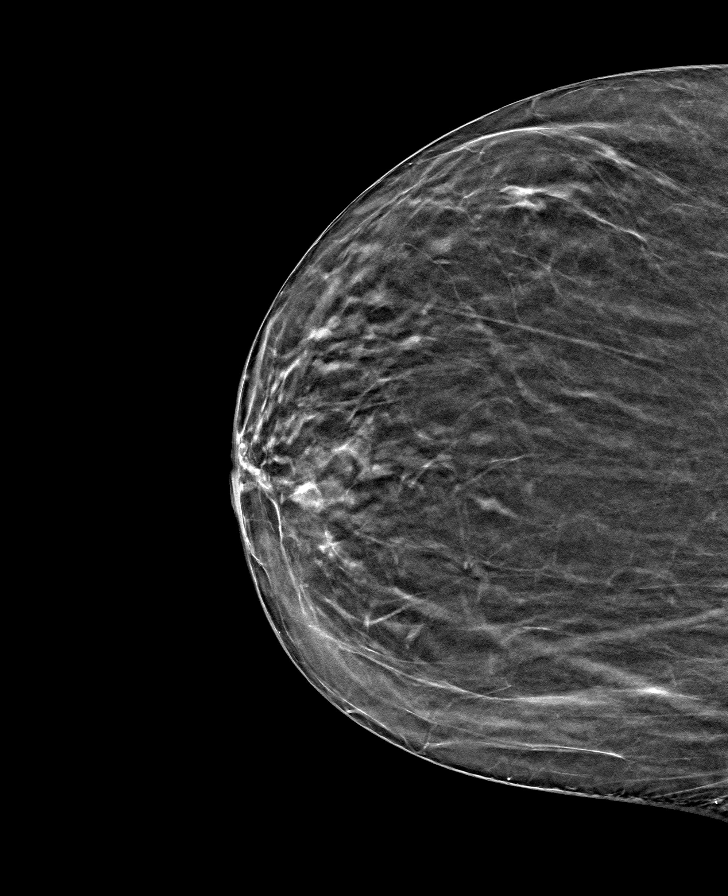

[L MLO tomo · tomo slice 38/75.0]
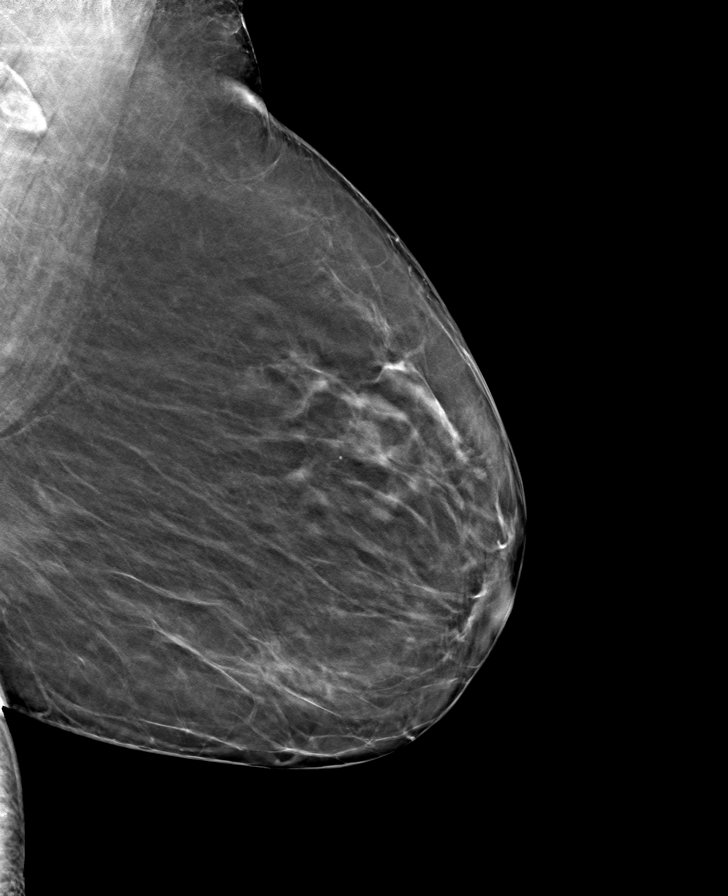

[R MLO tomo · tomo slice 35/69.0]
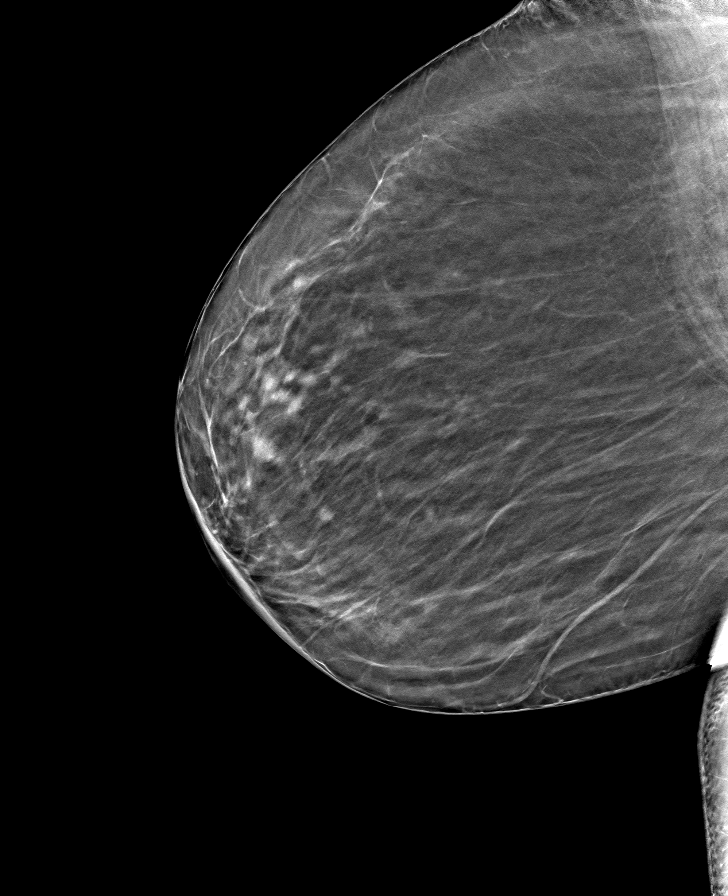

[8 of 24 positions shown; findings below may reference images not displayed]

ACR Breast Density Category b: There are scattered areas of
fibroglandular density.
FINDINGS: There are no findings suspicious for malignancy.
IMPRESSION: No mammographic evidence of malignancy. A result letter of this
screening mammogram will be mailed directly to the patient.

RECOMMENDATION:
Screening mammogram in one year. (Code:51-O-LD2)

BI-RADS CATEGORY  1: Negative.

## 2022-10-06 ENCOUNTER — Other Ambulatory Visit: Payer: Self-pay | Admitting: Internal Medicine

## 2022-10-06 DIAGNOSIS — Z1231 Encounter for screening mammogram for malignant neoplasm of breast: Secondary | ICD-10-CM

## 2022-11-10 ENCOUNTER — Ambulatory Visit
Admission: RE | Admit: 2022-11-10 | Discharge: 2022-11-10 | Disposition: A | Discharge status: Home / Self Care | Payer: Medicare Other | Source: Ambulatory Visit | Attending: Internal Medicine | Admitting: Internal Medicine

## 2022-11-10 DIAGNOSIS — Z1231 Encounter for screening mammogram for malignant neoplasm of breast: Secondary | ICD-10-CM | POA: Diagnosis not present

## 2023-09-05 ENCOUNTER — Other Ambulatory Visit: Payer: Self-pay | Admitting: Internal Medicine

## 2023-09-05 DIAGNOSIS — Z1231 Encounter for screening mammogram for malignant neoplasm of breast: Secondary | ICD-10-CM

## 2023-11-14 ENCOUNTER — Ambulatory Visit
Admission: RE | Admit: 2023-11-14 | Discharge: 2023-11-14 | Disposition: A | Discharge status: Home / Self Care | Payer: Medicare Other | Source: Ambulatory Visit | Attending: Internal Medicine | Admitting: Internal Medicine

## 2023-11-14 DIAGNOSIS — Z1231 Encounter for screening mammogram for malignant neoplasm of breast: Secondary | ICD-10-CM | POA: Insufficient documentation

## 2023-12-22 ENCOUNTER — Encounter: Payer: Self-pay | Admitting: Ophthalmology

## 2023-12-29 NOTE — Discharge Instructions (Signed)

## 2024-01-02 ENCOUNTER — Encounter
Admission: RE | Disposition: A | Discharge status: Home / Self Care | Payer: Self-pay | Source: Home / Self Care | Attending: Ophthalmology

## 2024-01-02 ENCOUNTER — Ambulatory Visit
Admission: RE | Admit: 2024-01-02 | Discharge: 2024-01-02 | Disposition: A | Discharge status: Home / Self Care | Payer: Medicare Other | Attending: Ophthalmology | Admitting: Ophthalmology

## 2024-01-02 ENCOUNTER — Ambulatory Visit: Payer: Self-pay | Admitting: Anesthesiology

## 2024-01-02 ENCOUNTER — Encounter: Payer: Self-pay | Admitting: Ophthalmology

## 2024-01-02 ENCOUNTER — Other Ambulatory Visit: Payer: Self-pay

## 2024-01-02 DIAGNOSIS — F419 Anxiety disorder, unspecified: Secondary | ICD-10-CM | POA: Diagnosis not present

## 2024-01-02 DIAGNOSIS — E669 Obesity, unspecified: Secondary | ICD-10-CM | POA: Insufficient documentation

## 2024-01-02 DIAGNOSIS — Z7984 Long term (current) use of oral hypoglycemic drugs: Secondary | ICD-10-CM | POA: Diagnosis not present

## 2024-01-02 DIAGNOSIS — H2512 Age-related nuclear cataract, left eye: Secondary | ICD-10-CM | POA: Insufficient documentation

## 2024-01-02 DIAGNOSIS — I10 Essential (primary) hypertension: Secondary | ICD-10-CM | POA: Diagnosis not present

## 2024-01-02 DIAGNOSIS — Z79899 Other long term (current) drug therapy: Secondary | ICD-10-CM | POA: Insufficient documentation

## 2024-01-02 DIAGNOSIS — Z6828 Body mass index (BMI) 28.0-28.9, adult: Secondary | ICD-10-CM | POA: Insufficient documentation

## 2024-01-02 DIAGNOSIS — Z87891 Personal history of nicotine dependence: Secondary | ICD-10-CM | POA: Diagnosis not present

## 2024-01-02 DIAGNOSIS — M1990 Unspecified osteoarthritis, unspecified site: Secondary | ICD-10-CM | POA: Insufficient documentation

## 2024-01-02 DIAGNOSIS — Z7982 Long term (current) use of aspirin: Secondary | ICD-10-CM | POA: Diagnosis not present

## 2024-01-02 DIAGNOSIS — E1136 Type 2 diabetes mellitus with diabetic cataract: Secondary | ICD-10-CM | POA: Insufficient documentation

## 2024-01-02 HISTORY — DX: Generalized anxiety disorder: F41.1

## 2024-01-02 HISTORY — DX: Presence of external hearing-aid: Z97.4

## 2024-01-02 HISTORY — PX: CATARACT EXTRACTION W/PHACO: SHX586

## 2024-01-02 HISTORY — DX: Presence of dental prosthetic device (complete) (partial): Z97.2

## 2024-01-02 HISTORY — DX: Dizziness and giddiness: R42

## 2024-01-02 LAB — GLUCOSE, CAPILLARY: Glucose-Capillary: 129 mg/dL — ABNORMAL HIGH (ref 70–99)

## 2024-01-02 SURGERY — PHACOEMULSIFICATION, CATARACT, WITH IOL INSERTION
Anesthesia: Monitor Anesthesia Care | Laterality: Left

## 2024-01-02 MED ORDER — MOXIFLOXACIN HCL 0.5 % OP SOLN
OPHTHALMIC | Status: DC | PRN
Start: 2024-01-02 — End: 2024-01-02
  Administered 2024-01-02: .2 mL via OPHTHALMIC

## 2024-01-02 MED ORDER — TETRACAINE HCL 0.5 % OP SOLN
OPHTHALMIC | Status: AC
  Filled 2024-01-02: qty 4

## 2024-01-02 MED ORDER — FENTANYL CITRATE (PF) 100 MCG/2ML IJ SOLN
INTRAMUSCULAR | Status: AC
  Filled 2024-01-02: qty 2

## 2024-01-02 MED ORDER — SODIUM CHLORIDE 0.9% FLUSH
INTRAVENOUS | Status: DC | PRN
  Administered 2024-01-02 (×3): 10 mL via INTRAVENOUS

## 2024-01-02 MED ORDER — MIDAZOLAM HCL 2 MG/2ML IJ SOLN
INTRAMUSCULAR | Status: AC
  Filled 2024-01-02: qty 2

## 2024-01-02 MED ORDER — MIDAZOLAM HCL 2 MG/2ML IJ SOLN
INTRAMUSCULAR | Status: DC | PRN
  Administered 2024-01-02 (×2): .5 mg via INTRAVENOUS
  Administered 2024-01-02: 1 mg via INTRAVENOUS

## 2024-01-02 MED ORDER — TETRACAINE HCL 0.5 % OP SOLN
1.0000 [drp] | OPHTHALMIC | Status: DC | PRN
  Administered 2024-01-02 (×3): 1 [drp] via OPHTHALMIC

## 2024-01-02 MED ORDER — FENTANYL CITRATE (PF) 100 MCG/2ML IJ SOLN
INTRAMUSCULAR | Status: DC | PRN
  Administered 2024-01-02 (×2): 25 ug via INTRAVENOUS

## 2024-01-02 MED ORDER — ARMC OPHTHALMIC DILATING DROPS
1.0000 | OPHTHALMIC | Status: DC | PRN
  Administered 2024-01-02 (×3): 1 via OPHTHALMIC

## 2024-01-02 MED ORDER — LIDOCAINE HCL (PF) 2 % IJ SOLN
INTRAOCULAR | Status: DC | PRN
  Administered 2024-01-02: 4 mL via INTRAOCULAR

## 2024-01-02 MED ORDER — SIGHTPATH DOSE#1 NA HYALUR & NA CHOND-NA HYALUR IO KIT
PACK | INTRAOCULAR | Status: DC | PRN
  Administered 2024-01-02: 1 via OPHTHALMIC

## 2024-01-02 MED ORDER — ARMC OPHTHALMIC DILATING DROPS
OPHTHALMIC | Status: AC
  Filled 2024-01-02: qty 0.5

## 2024-01-02 MED ORDER — SIGHTPATH DOSE#1 BSS IO SOLN
INTRAOCULAR | Status: DC | PRN
  Administered 2024-01-02: 15 mL via INTRAOCULAR

## 2024-01-02 MED ORDER — SIGHTPATH DOSE#1 BSS IO SOLN
INTRAOCULAR | Status: DC | PRN
  Administered 2024-01-02: 77 mL via OPHTHALMIC

## 2024-01-02 SURGICAL SUPPLY — 12 items
CATARACT SUITE SIGHTPATH (MISCELLANEOUS) ×1 IMPLANT
DISSECTOR HYDRO NUCLEUS 50X22 (MISCELLANEOUS) ×1 IMPLANT
FEE CATARACT SUITE SIGHTPATH (MISCELLANEOUS) ×1 IMPLANT
GLOVE PI ULTRA LF STRL 7.5 (GLOVE) ×1 IMPLANT
GLOVE SURG POLYISOPRENE 8.5 (GLOVE) ×1 IMPLANT
GLOVE SURG PROTEXIS BL SZ6.5 (GLOVE) ×1 IMPLANT
GLOVE SURG SYN 6.5 PF PI BL (GLOVE) ×1 IMPLANT
GLOVE SURG SYN 8.5 PF PI BL (GLOVE) ×1 IMPLANT
LENS IOL TECNIS EYHANCE 19.0 (Intraocular Lens) IMPLANT
NDL FILTER BLUNT 18X1 1/2 (NEEDLE) ×1 IMPLANT
NEEDLE FILTER BLUNT 18X1 1/2 (NEEDLE) ×1 IMPLANT
SYR 3ML LL SCALE MARK (SYRINGE) ×1 IMPLANT

## 2024-01-02 NOTE — Op Note (Signed)
 OPERATIVE NOTE  Rebecca Frazier [id number] 01/02/2024   PREOPERATIVE DIAGNOSIS:  Nuclear sclerotic cataract left eye.  H25.12   POSTOPERATIVE DIAGNOSIS:    Nuclear sclerotic cataract left eye.     PROCEDURE:  Phacoemusification with posterior chamber intraocular lens placement of the left eye   LENS:   Implant Name Type Inv. Item Serial No. Manufacturer Lot No. LRB No. Used Action  LENS IOL TECNIS EYHANCE 19.0 - [id number] Intraocular Lens LENS IOL TECNIS EYHANCE 19.0 [id number] [employer]  Left 1 Implanted      Procedure(s): CATARACT EXTRACTION PHACO AND INTRAOCULAR LENS PLACEMENT (IOC) LEFT DIABETIC 6.75 00:41.0 (Left)  SURGEON:  Willey Blade, MD, MPH   ANESTHESIA:  Topical with tetracaine drops augmented with 1% preservative-free intracameral lidocaine.  ESTIMATED BLOOD LOSS: <1 mL   COMPLICATIONS:  None.   DESCRIPTION OF PROCEDURE:  The patient was identified in the holding room and transported to the operating room and placed in the supine position under the operating microscope.  The left eye was identified as the operative eye and it was prepped and draped in the usual sterile ophthalmic fashion.   A 1.0 millimeter clear-corneal paracentesis was made at the 5:00 position. 0.5 ml of preservative-free 1% lidocaine with epinephrine was injected into the anterior chamber.  The anterior chamber was filled with viscoelastic.  A 2.4 millimeter keratome was used to make a near-clear corneal incision at the 2:00 position.  A curvilinear capsulorrhexis was made with a cystotome and capsulorrhexis forceps.  Balanced salt solution was used to hydrodissect and hydrodelineate the nucleus.   Phacoemulsification was then used in stop and chop fashion to remove the lens nucleus and epinucleus.  The remaining cortex was then removed using the irrigation and aspiration handpiece. Viscoelastic was then placed into the capsular bag to distend it for lens placement.  A lens was then injected into the  capsular bag.  The remaining viscoelastic was aspirated.   Wounds were hydrated with balanced salt solution.  The anterior chamber was inflated to a physiologic pressure with balanced salt solution.  Intracameral vigamox 0.1 mL undiltued was injected into the eye and a drop placed onto the ocular surface.  No wound leaks were noted.  The patient was taken to the recovery room in stable condition without complications of anesthesia or surgery  Willey Blade 01/02/2024, 10:41 AM

## 2024-01-02 NOTE — Transfer of Care (Signed)
 Immediate Anesthesia Transfer of Care Note  Patient: Rebecca Frazier  Procedure(s) Performed: CATARACT EXTRACTION PHACO AND INTRAOCULAR LENS PLACEMENT (IOC) LEFT DIABETIC 6.75 00:41.0 (Left)  Patient Location: PACU  Anesthesia Type:MAC  Level of Consciousness: awake, alert , and oriented  Airway & Oxygen Therapy: Patient Spontanous Breathing  Post-op Assessment: Report given to RN and Post -op Vital signs reviewed and stable  Post vital signs: Reviewed and stable  Last Vitals: See pacu flow sheet for normal temp Vitals Value Taken Time  BP 127/65 01/02/24 1046  Temp    Pulse 86 01/02/24 1048  Resp 16 01/02/24 1048  SpO2 99 % 01/02/24 1048  Vitals shown include unfiled device data.  Last Pain:  Vitals:   01/02/24 1043  TempSrc:   PainSc: 0-No pain         Complications: No notable events documented.

## 2024-01-02 NOTE — Anesthesia Preprocedure Evaluation (Signed)
 Anesthesia Evaluation  Patient identified by MRN, date of birth, ID band Patient awake    Reviewed: Allergy & Precautions, H&P , NPO status , Patient's Chart, lab work & pertinent test results  Airway Mallampati: III  TM Distance: >3 FB Neck ROM: Full    Dental no notable dental hx. (+) Chipped, Caps Cap/crown left upper lateral incisor:   Pulmonary neg pulmonary ROS, former smoker   Pulmonary exam normal breath sounds clear to auscultation       Cardiovascular hypertension, negative cardio ROS Normal cardiovascular exam Rhythm:Regular Rate:Normal     Neuro/Psych  PSYCHIATRIC DISORDERS Anxiety     negative neurological ROS  negative psych ROS   GI/Hepatic negative GI ROS, Neg liver ROS,,,  Endo/Other  negative endocrine ROSdiabetes    Renal/GU negative Renal ROS  negative genitourinary   Musculoskeletal negative musculoskeletal ROS (+) Arthritis ,    Abdominal   Peds negative pediatric ROS (+)  Hematology negative hematology ROS (+)   Anesthesia Other Findings Chronic constipation  Arthritis Diabetes mellitus without complication Hypertension Hyperlipemia  Obesity Wears hearing aid in both ears Wears dentures Vertigo  Generalized anxiety disorder    Reproductive/Obstetrics negative OB ROS                              Anesthesia Physical Anesthesia Plan  ASA: 2  Anesthesia Plan: MAC   Post-op Pain Management:    Induction: Intravenous  PONV Risk Score and Plan:   Airway Management Planned: Natural Airway and Nasal Cannula  Additional Equipment:   Intra-op Plan:   Post-operative Plan:   Informed Consent: I have reviewed the patients History and Physical, chart, labs and discussed the procedure including the risks, benefits and alternatives for the proposed anesthesia with the patient or authorized representative who has indicated his/her understanding and acceptance.      Dental Advisory Given  Plan Discussed with: Anesthesiologist, CRNA and Surgeon  Anesthesia Plan Comments: (Patient consented for risks of anesthesia including but not limited to:  - adverse reactions to medications - damage to eyes, teeth, lips or other oral mucosa - nerve damage due to positioning  - sore throat or hoarseness - Damage to heart, brain, nerves, lungs, other parts of body or loss of life  Patient voiced understanding and assent.)         Anesthesia Quick Evaluation

## 2024-01-02 NOTE — Anesthesia Postprocedure Evaluation (Signed)
 Anesthesia Post Note  Patient: Rebecca Frazier  Procedure(s) Performed: CATARACT EXTRACTION PHACO AND INTRAOCULAR LENS PLACEMENT (IOC) LEFT DIABETIC 6.75 00:41.0 (Left)  Patient location during evaluation: PACU Anesthesia Type: MAC Level of consciousness: awake and alert Pain management: pain level controlled Vital Signs Assessment: post-procedure vital signs reviewed and stable Respiratory status: spontaneous breathing, nonlabored ventilation, respiratory function stable and patient connected to nasal cannula oxygen Cardiovascular status: stable and blood pressure returned to baseline Postop Assessment: no apparent nausea or vomiting Anesthetic complications: no   No notable events documented.   Last Vitals:  Vitals:   01/02/24 1045 01/02/24 1049  BP: 127/65 125/76  Pulse: 85 86  Resp: 13 16  Temp:  (!) 36.2 C  SpO2: 97% 99%    Last Pain:  Vitals:   01/02/24 1049  TempSrc:   PainSc: 0-No pain                 Jaxsun Ciampi C [facility]

## 2024-01-02 NOTE — H&P (Signed)
 [facility]   Primary Care Physician:  Barbette Reichmann, MD Ophthalmologist: Dr. Willey Blade  Pre-Procedure History & Physical: HPI:  Rebecca Frazier is a 83 y.o. female here for cataract surgery.   Past Medical History:  Diagnosis Date   Arthritis    Chronic constipation    Diabetes mellitus without complication (HCC)    Generalized anxiety disorder    Hyperlipemia    Hypertension    Obesity    Vertigo    none recently   Wears dentures    partial upper   Wears hearing aid in both ears     Past Surgical History:  Procedure Laterality Date   ABDOMINAL HYSTERECTOMY     CESAREAN SECTION     COLONOSCOPY     COLONOSCOPY WITH PROPOFOL N/A 12/08/2015   Procedure: COLONOSCOPY WITH PROPOFOL;  Surgeon: Christena Deem, MD;  Location: [facility] ENDOSCOPY;  Service: Endoscopy;  Laterality: N/A;   JOINT REPLACEMENT     LAPAROSCOPIC SIGMOID COLECTOMY     TOTAL ABDOMINAL HYSTERECTOMY W/ BILATERAL SALPINGOOPHORECTOMY     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 Authorizing Provider  acetaminophen (TYLENOL) 650 MG CR tablet Take 650 mg by mouth every 8 (eight) hours as needed for pain.   Yes [provider]  amLODipine (NORVASC) 10 MG tablet Take 10 mg by mouth daily.   Yes [provider]  Ascorbic Acid (VITAMIN C) 1000 MG tablet Take 1,000 mg by mouth daily.   Yes [provider]  aspirin EC 81 MG tablet Take 81 mg by mouth daily.   Yes [provider]  Calcium Carbonate-Vitamin D (CALTRATE 600+D) 600-400 MG-UNIT tablet Take 1 tablet by mouth daily.   Yes [provider]  hydrochlorothiazide (MICROZIDE) 12.5 MG capsule Take 12.5 mg by mouth daily.   Yes [provider]  losartan (COZAAR) 100 MG tablet Take 100 mg by mouth daily.   Yes [provider]  lovastatin (MEVACOR) 20 MG tablet Take 20 mg by mouth at bedtime.   Yes [provider]  metFORMIN (GLUCOPHAGE) 500 MG tablet Take 500 mg by mouth 2 (two) times daily with a meal.   Yes [provider]  Multiple Vitamin (MULTIVITAMIN) capsule Take 1 capsule by mouth daily.   Yes [provider]  polyethylene glycol (MIRALAX / GLYCOLAX) 17 g packet Take 17 g by mouth daily.   Yes [provider]  traMADol-acetaminophen (ULTRACET) 37.5-325 MG tablet Take 1 tablet by mouth every 6 (six) hours as needed.   Yes [provider]  vitamin E 400 UNIT capsule Take 400 Units by mouth daily.   Yes [provider]    Allergies as of 12/02/2023 - Review Complete 02/23/2017  Allergen Reaction Noted   Ace inhibitors  12/05/2015    Family History  Problem Relation Age of Onset   Breast cancer Neg Hx     Social History   Socioeconomic History   Marital status: Married    Spouse name: Not on file   Number of children: Not on file   Years of education: Not on file   Highest education level: Not on file  Occupational History   Not on file  Tobacco Use   Smoking status: Former    Current packs/day: 0.00    Types: Cigarettes    Quit date: 2015    Years since quitting: 10.2  Smokeless tobacco: Former  Building services engineer status: Never Used  Substance and Sexual Activity   Alcohol use: No   Drug use: No   Sexual activity: Not on file  Other Topics Concern   Not on file  Social History Narrative   Not on file   Social Drivers of Health   Financial Resource Strain: Low Risk  (12/30/2023)   Received from [facility]   Overall Financial Resource Strain (CARDIA)    Difficulty of Paying Living Expenses: Not hard at all  Food Insecurity: No Food Insecurity (12/30/2023)   Received [facility]   Hunger Vital Sign    Worried About Running Out of Food in the Last Year: Never true    Ran Out of Food in the Last Year: Never true  Transportation Needs: No Transportation Needs  (12/30/2023)   Received from [facility] - Transportation    In the past 12 months, has lack of transportation kept you from medical appointments or from getting medications?: No    Lack of Transportation (Non-Medical): No  Physical Activity: Not on file  Stress: Not on file  Social Connections: Not on file  Intimate Partner Violence: Not on file    Review of Systems: See HPI, otherwise negative ROS  Physical Exam: BP (!) 142/79   Pulse 94   Temp (!) 97.2 F (36.2 C) (Temporal)   Resp 18   Ht 5\' 3"  (1.6 m)   Wt 73.5 kg   SpO2 100%   BMI 28.70 kg/m  General:   Alert, cooperative in NAD Head:  Normocephalic and atraumatic. Respiratory:  Normal work of breathing. Cardiovascular:  RRR  Impression/Plan: Rebecca Frazier is here for cataract surgery.  Risks, benefits, limitations, and alternatives regarding cataract surgery have been reviewed with the patient.  Questions have been answered.  All parties agreeable.   Willey Blade, MD  01/02/2024, 10:12 AM

## 2024-01-03 ENCOUNTER — Encounter: Payer: Self-pay | Admitting: Ophthalmology

## 2024-01-03 NOTE — Anesthesia Preprocedure Evaluation (Addendum)
 Anesthesia Evaluation  Patient identified by MRN, date of birth, ID band Patient awake    Reviewed: Allergy & Precautions, H&P , NPO status , Patient's Chart, lab work & pertinent test results  Airway Mallampati: III  TM Distance: >3 FB Neck ROM: Full    Dental no notable dental hx. (+) Chipped, Caps Chipped, Caps Cap/crown left upper lateral incisor: :   Pulmonary neg pulmonary ROS, former smoker   Pulmonary exam normal breath sounds clear to auscultation       Cardiovascular hypertension, negative cardio ROS Normal cardiovascular exam Rhythm:Regular Rate:Normal     Neuro/Psych  PSYCHIATRIC DISORDERS Anxiety     negative neurological ROS  negative psych ROS   GI/Hepatic negative GI ROS, Neg liver ROS,,,  Endo/Other  negative endocrine ROSdiabetes    Renal/GU negative Renal ROS  negative genitourinary   Musculoskeletal negative musculoskeletal ROS (+) Arthritis ,    Abdominal   Peds negative pediatric ROS (+)  Hematology negative hematology ROS (+)   Anesthesia Other Findings Previous cataract surgery 01-02-23 Dr. Aldo Amble   Chronic constipation     Arthritis Diabetes mellitus without complication Hypertension Hyperlipemia             Obesity Wears hearing aid in both ears dentures Vertigo             Generalized anxiety disorder   Reproductive/Obstetrics negative OB ROS                             Anesthesia Physical Anesthesia Plan  ASA: 2  Anesthesia Plan: MAC   Post-op Pain Management:    Induction: Intravenous  PONV Risk Score and Plan:   Airway Management Planned: Natural Airway and Nasal Cannula  Additional Equipment:   Intra-op Plan:   Post-operative Plan:   Informed Consent: I have reviewed the patients History and Physical, chart, labs and discussed the procedure including the risks, benefits and alternatives for the proposed anesthesia with the patient or  authorized representative who has indicated his/her understanding and acceptance.     Dental Advisory Given  Plan Discussed with: Anesthesiologist, CRNA and Surgeon  Anesthesia Plan Comments: (Patient consented for risks of anesthesia including but not limited to:  - adverse reactions to medications - damage to eyes, teeth, lips or other oral mucosa - nerve damage due to positioning  - sore throat or hoarseness - Damage to heart, brain, nerves, lungs, other parts of body or loss of life  Patient voiced understanding and assent.)        Anesthesia Quick Evaluation

## 2024-01-12 NOTE — Discharge Instructions (Signed)

## 2024-01-16 ENCOUNTER — Other Ambulatory Visit: Payer: Self-pay

## 2024-01-16 ENCOUNTER — Ambulatory Visit: Payer: Self-pay | Admitting: Anesthesiology

## 2024-01-16 ENCOUNTER — Encounter: Payer: Self-pay | Admitting: Ophthalmology

## 2024-01-16 ENCOUNTER — Encounter
Admission: RE | Disposition: A | Discharge status: Home / Self Care | Payer: Self-pay | Source: Home / Self Care | Attending: Ophthalmology

## 2024-01-16 ENCOUNTER — Ambulatory Visit
Admission: RE | Admit: 2024-01-16 | Discharge: 2024-01-16 | Disposition: A | Discharge status: Home / Self Care | Payer: Medicare Other | Attending: Ophthalmology | Admitting: Ophthalmology

## 2024-01-16 DIAGNOSIS — H2511 Age-related nuclear cataract, right eye: Secondary | ICD-10-CM | POA: Insufficient documentation

## 2024-01-16 DIAGNOSIS — E1136 Type 2 diabetes mellitus with diabetic cataract: Secondary | ICD-10-CM | POA: Diagnosis present

## 2024-01-16 DIAGNOSIS — Z7984 Long term (current) use of oral hypoglycemic drugs: Secondary | ICD-10-CM | POA: Insufficient documentation

## 2024-01-16 DIAGNOSIS — I10 Essential (primary) hypertension: Secondary | ICD-10-CM | POA: Diagnosis not present

## 2024-01-16 DIAGNOSIS — Z87891 Personal history of nicotine dependence: Secondary | ICD-10-CM | POA: Diagnosis not present

## 2024-01-16 HISTORY — PX: CATARACT EXTRACTION W/PHACO: SHX586

## 2024-01-16 LAB — GLUCOSE, CAPILLARY: Glucose-Capillary: 140 mg/dL — ABNORMAL HIGH (ref 70–99)

## 2024-01-16 SURGERY — PHACOEMULSIFICATION, CATARACT, WITH IOL INSERTION
Anesthesia: Monitor Anesthesia Care | Site: Eye | Laterality: Right

## 2024-01-16 MED ORDER — MIDAZOLAM HCL 2 MG/2ML IJ SOLN
INTRAMUSCULAR | Status: AC
  Filled 2024-01-16: qty 2

## 2024-01-16 MED ORDER — MOXIFLOXACIN HCL 0.5 % OP SOLN
OPHTHALMIC | Status: DC | PRN
  Administered 2024-01-16: .2 mL via OPHTHALMIC

## 2024-01-16 MED ORDER — EPINEPHRINE PF 1 MG/ML IJ SOLN
INTRAMUSCULAR | Status: DC | PRN
  Administered 2024-01-16: 109 mL via OPHTHALMIC

## 2024-01-16 MED ORDER — FENTANYL CITRATE (PF) 100 MCG/2ML IJ SOLN
INTRAMUSCULAR | Status: DC | PRN
  Administered 2024-01-16: 50 ug via INTRAVENOUS

## 2024-01-16 MED ORDER — SIGHTPATH DOSE#1 BSS IO SOLN
INTRAOCULAR | Status: DC | PRN
  Administered 2024-01-16: 15 mL via INTRAOCULAR

## 2024-01-16 MED ORDER — TETRACAINE HCL 0.5 % OP SOLN
OPHTHALMIC | Status: AC
Start: 2024-01-16 — End: ?
  Filled 2024-01-16: qty 4

## 2024-01-16 MED ORDER — LIDOCAINE HCL (PF) 2 % IJ SOLN
INTRAOCULAR | Status: DC | PRN
  Administered 2024-01-16: 4 mL via INTRAOCULAR

## 2024-01-16 MED ORDER — TETRACAINE HCL 0.5 % OP SOLN
1.0000 [drp] | OPHTHALMIC | Status: DC | PRN
  Administered 2024-01-16 (×3): 1 [drp] via OPHTHALMIC

## 2024-01-16 MED ORDER — ARMC OPHTHALMIC DILATING DROPS
1.0000 | OPHTHALMIC | Status: DC | PRN
Start: 2024-01-16 — End: 2024-01-16
  Administered 2024-01-16 (×3): 1 via OPHTHALMIC

## 2024-01-16 MED ORDER — SIGHTPATH DOSE#1 NA HYALUR & NA CHOND-NA HYALUR IO KIT
PACK | INTRAOCULAR | Status: DC | PRN
  Administered 2024-01-16: 1 via OPHTHALMIC

## 2024-01-16 MED ORDER — FENTANYL CITRATE (PF) 100 MCG/2ML IJ SOLN
INTRAMUSCULAR | Status: AC
  Filled 2024-01-16: qty 2

## 2024-01-16 MED ORDER — ARMC OPHTHALMIC DILATING DROPS
OPHTHALMIC | Status: AC
  Filled 2024-01-16: qty 0.5

## 2024-01-16 MED ORDER — MIDAZOLAM HCL 2 MG/2ML IJ SOLN
INTRAMUSCULAR | Status: DC | PRN
  Administered 2024-01-16 (×2): 1 mg via INTRAVENOUS

## 2024-01-16 SURGICAL SUPPLY — 12 items
CATARACT SUITE SIGHTPATH (MISCELLANEOUS) ×1 IMPLANT
DISSECTOR HYDRO NUCLEUS 50X22 (MISCELLANEOUS) ×1 IMPLANT
FEE CATARACT SUITE SIGHTPATH (MISCELLANEOUS) ×1 IMPLANT
GLOVE PI ULTRA LF STRL 7.5 (GLOVE) ×1 IMPLANT
GLOVE SURG POLYISOPRENE 8.5 (GLOVE) ×1 IMPLANT
GLOVE SURG PROTEXIS BL SZ6.5 (GLOVE) ×1 IMPLANT
GLOVE SURG SYN 6.5 PF PI BL (GLOVE) ×1 IMPLANT
GLOVE SURG SYN 8.5 PF PI BL (GLOVE) ×1 IMPLANT
LENS IOL TECNIS EYHANCE 18.0 (Intraocular Lens) IMPLANT
NDL FILTER BLUNT 18X1 1/2 (NEEDLE) ×1 IMPLANT
NEEDLE FILTER BLUNT 18X1 1/2 (NEEDLE) ×1 IMPLANT
SYR 3ML LL SCALE MARK (SYRINGE) ×1 IMPLANT

## 2024-01-16 NOTE — Anesthesia Postprocedure Evaluation (Signed)
 Anesthesia Post Note  Patient: Rebecca Frazier  Procedure(s) Performed: CATARACT EXTRACTION PHACO AND INTRAOCULAR LENS PLACEMENT (IOC) RIGHT  6.13 00:38.5 (Right: Eye)  Patient location during evaluation: PACU Anesthesia Type: MAC Level of consciousness: awake and alert Pain management: pain level controlled Vital Signs Assessment: post-procedure vital signs reviewed and stable Respiratory status: spontaneous breathing, nonlabored ventilation, respiratory function stable and patient connected to nasal cannula oxygen Cardiovascular status: stable and blood pressure returned to baseline Postop Assessment: no apparent nausea or vomiting Anesthetic complications: no   No notable events documented.   Last Vitals:  Vitals:   01/16/24 0823 01/16/24 0827  BP: 122/62 125/62  Pulse: 89 89  Resp: 14 (!) 22  Temp: (!) 36.4 C (!) 36.4 C  SpO2: 100% 97%    Last Pain:  Vitals:   01/16/24 0827  TempSrc:   PainSc: 0-No pain                 Zeppelin Beckstrand C Gordie Belvin

## 2024-01-16 NOTE — Transfer of Care (Signed)
 Immediate Anesthesia Transfer of Care Note  Patient: Rebecca Frazier  Procedure(s) Performed: CATARACT EXTRACTION PHACO AND INTRAOCULAR LENS PLACEMENT (IOC) RIGHT  6.13 00:38.5 (Right: Eye)  Patient Location: PACU  Anesthesia Type: MAC  Level of Consciousness: awake, alert  and patient cooperative  Airway and Oxygen Therapy: Patient Spontanous Breathing and Patient connected to supplemental oxygen  Post-op Assessment: Post-op Vital signs reviewed, Patient's Cardiovascular Status Stable, Respiratory Function Stable, Patent Airway and No signs of Nausea or vomiting  Post-op Vital Signs: Reviewed and stable  Complications: No notable events documented.

## 2024-01-16 NOTE — Op Note (Signed)
 OPERATIVE NOTE  Rebecca Frazier [id number] 01/16/2024   PREOPERATIVE DIAGNOSIS:  Nuclear sclerotic cataract right eye.  H25.11   POSTOPERATIVE DIAGNOSIS:    Nuclear sclerotic cataract right eye.     PROCEDURE:  Phacoemusification with posterior chamber intraocular lens placement of the right eye   LENS:   Implant Name Type Inv. Item Serial No. Manufacturer Lot No. LRB No. Used Action  LENS IOL TECNIS EYHANCE 18.0 - [id number] Intraocular Lens LENS IOL TECNIS EYHANCE 18.0 [id number] [employer]  Right 1 Implanted       Procedure(s): CATARACT EXTRACTION PHACO AND INTRAOCULAR LENS PLACEMENT (IOC) RIGHT  6.13 00:38.5 (Right)  SURGEON:  Dusty Gin, MD, MPH  ANESTHESIOLOGIST: Anesthesiologist: Emilie Harden, MD CRNA: Jahoo, Sonia, CRNA   ANESTHESIA:  Topical with tetracaine drops augmented with 1% preservative-free intracameral lidocaine.  ESTIMATED BLOOD LOSS: less than 1 mL.   COMPLICATIONS:  None.   DESCRIPTION OF PROCEDURE:  The patient was identified in the holding room and transported to the operating room and placed in the supine position under the operating microscope.  The right eye was identified as the operative eye and it was prepped and draped in the usual sterile ophthalmic fashion.   A 1.0 millimeter clear-corneal paracentesis was made at the 10:30 position. 0.5 ml of preservative-free 1% lidocaine with epinephrine was injected into the anterior chamber.  The anterior chamber was filled with viscoelastic.  A 2.4 millimeter keratome was used to make a near-clear corneal incision at the 8:00 position.  A curvilinear capsulorrhexis was made with a cystotome and capsulorrhexis forceps.  Balanced salt solution was used to hydrodissect and hydrodelineate the nucleus.   Phacoemulsification was then used in stop and chop fashion to remove the lens nucleus and epinucleus.  The remaining cortex was then removed using the irrigation and aspiration handpiece. Viscoelastic was then  placed into the capsular bag to distend it for lens placement.  A lens was then injected into the capsular bag.  The remaining viscoelastic was aspirated.   Wounds were hydrated with balanced salt solution.  The anterior chamber was inflated to a physiologic pressure with balanced salt solution.   Intracameral vigamox 0.1 mL undiluted was injected into the eye and a drop placed onto the ocular surface.  No wound leaks were noted.  The patient was taken to the recovery room in stable condition without complications of anesthesia or surgery  Dusty Gin 01/16/2024, 8:22 AM

## 2024-01-16 NOTE — H&P (Signed)
 [facility]   Primary Care Physician:  Barbette Reichmann, MD Ophthalmologist: Dr. Willey Blade  Pre-Procedure History & Physical: HPI:  Rebecca Frazier is a 83 y.o. female here for cataract surgery.   Past Medical History:  Diagnosis Date   Arthritis    Chronic constipation    Diabetes mellitus without complication (HCC)    Generalized anxiety disorder    Hyperlipemia    Hypertension    Obesity    Vertigo    none recently   Wears dentures    partial upper   Wears hearing aid in both ears     Past Surgical History:  Procedure Laterality Date   ABDOMINAL HYSTERECTOMY     CATARACT EXTRACTION W/PHACO Left 01/02/2024   Procedure: CATARACT EXTRACTION PHACO AND INTRAOCULAR LENS PLACEMENT (IOC) LEFT DIABETIC 6.75 00:41.0;  Surgeon: Nevada Crane, MD;  Location: [facility];  Service: Ophthalmology;  Laterality: Left;   CESAREAN SECTION     COLONOSCOPY     COLONOSCOPY WITH PROPOFOL N/A 12/08/2015   Procedure: COLONOSCOPY WITH PROPOFOL;  Surgeon: Christena Deem, MD;  Location: [facility] ENDOSCOPY;  Service: Endoscopy;  Laterality: N/A;   JOINT REPLACEMENT     LAPAROSCOPIC SIGMOID COLECTOMY     TOTAL ABDOMINAL HYSTERECTOMY W/ BILATERAL SALPINGOOPHORECTOMY     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 Authorizing Provider  acetaminophen (TYLENOL) 650 MG CR tablet Take 650 mg by mouth every 8 (eight) hours as needed for pain.   Yes [provider]  amLODipine (NORVASC) 10 MG tablet Take 10 mg by mouth daily.   Yes [provider]  Ascorbic Acid (VITAMIN C) 1000 MG tablet Take 1,000 mg by mouth daily.   Yes [provider]  aspirin EC 81 MG tablet Take 81 mg by mouth daily.   Yes [provider]  Calcium Carbonate-Vitamin D (CALTRATE 600+D) 600-400 MG-UNIT tablet Take 1 tablet by mouth daily.   Yes [provider]  hydrochlorothiazide  (MICROZIDE) 12.5 MG capsule Take 12.5 mg by mouth daily.   Yes [provider]  losartan (COZAAR) 100 MG tablet Take 100 mg by mouth daily.   Yes [provider]  lovastatin (MEVACOR) 20 MG tablet Take 20 mg by mouth at bedtime.   Yes [provider]  metFORMIN (GLUCOPHAGE) 500 MG tablet Take 500 mg by mouth 2 (two) times daily with a meal.   Yes [provider]  Multiple Vitamin (MULTIVITAMIN) capsule Take 1 capsule by mouth daily.   Yes [provider]  polyethylene glycol (MIRALAX / GLYCOLAX) 17 g packet Take 17 g by mouth daily.   Yes [provider]  traMADol-acetaminophen (ULTRACET) 37.5-325 MG tablet Take 1 tablet by mouth every 6 (six) hours as needed.   Yes [provider]  vitamin E 400 UNIT capsule Take 400 Units by mouth daily.   Yes [provider]    Allergies as of 12/02/2023 - Review Complete 02/23/2017  Allergen Reaction Noted   Ace inhibitors  12/05/2015    Family History  Problem Relation Age of Onset   Breast cancer Neg Hx     Social History   Socioeconomic History   Marital status: Married    Spouse name: Not on file   Number of children: Not on file   Years of education: Not on file   Highest education level: Not on file  Occupational History  Not on file  Tobacco Use   Smoking status: Former    Current packs/day: 0.00    Types: Cigarettes    Quit date: 2015    Years since quitting: 10.2   Smokeless tobacco: Former  Building services engineer status: Never Used  Substance and Sexual Activity   Alcohol use: No   Drug use: No   Sexual activity: Not on file  Other Topics Concern   Not on file  Social History Narrative   Not on file   Social Drivers of Health   Financial Resource Strain: Low Risk  (12/30/2023)   Received from [facility]   Overall Financial Resource Strain (CARDIA)    Difficulty of Paying Living Expenses: Not hard at all  Food Insecurity: No Food  Insecurity (12/30/2023)   Received from [facility]   Hunger Vital Sign    Worried About Running Out of Food in the Last Year: Never true    Ran Out of Food in the Last Year: Never true  Transportation Needs: No Transportation Needs (12/30/2023)   Received from [facility] - Transportation    In the past 12 months, has lack of transportation kept you from medical appointments or from getting medications?: No    Lack of Transportation (Non-Medical): No  Physical Activity: Not on file  Stress: Not on file  Social Connections: Not on file  Intimate Partner Violence: Not on file    Review of Systems: See HPI, otherwise negative ROS  Physical Exam: BP (!) 159/69   Pulse 100   Temp (!) 97.4 F (36.3 C) (Temporal)   Resp 17   Ht 5\' 3"  (1.6 m)   Wt 74.6 kg   SpO2 100%   BMI 29.12 kg/m  General:   Alert, cooperative. Head:  Normocephalic and atraumatic. Respiratory:  Normal work of breathing. Cardiovascular:  NAD  Impression/Plan: Rebecca Frazier is here for cataract surgery.  Risks, benefits, limitations, and alternatives regarding cataract surgery have been reviewed with the patient.  Questions have been answered.  All parties agreeable.   Dusty Gin, MD  01/16/2024, 7:55 AM

## 2024-10-05 ENCOUNTER — Other Ambulatory Visit: Payer: Self-pay | Admitting: Internal Medicine

## 2024-10-05 DIAGNOSIS — Z1231 Encounter for screening mammogram for malignant neoplasm of breast: Secondary | ICD-10-CM

## 2024-11-14 ENCOUNTER — Encounter
